# Patient Record
Sex: Male | Born: 1980 | Race: White | Hispanic: No | Marital: Single | Smoking: Current every day smoker
Health system: Southern US, Community
[De-identification: ages and names within clinical notes are randomized; demographics above are authoritative.]

---

## 2006-04-20 ENCOUNTER — Emergency Department (HOSPITAL_COMMUNITY): Admission: EM | Admit: 2006-04-20 | Discharge: 2006-04-20 | Payer: Self-pay | Admitting: Emergency Medicine

## 2006-09-16 ENCOUNTER — Emergency Department (HOSPITAL_COMMUNITY): Admission: EM | Admit: 2006-09-16 | Discharge: 2006-09-16 | Payer: Self-pay | Admitting: Emergency Medicine

## 2006-11-13 ENCOUNTER — Emergency Department (HOSPITAL_COMMUNITY): Admission: EM | Admit: 2006-11-13 | Discharge: 2006-11-13 | Payer: Self-pay | Admitting: Emergency Medicine

## 2007-09-03 ENCOUNTER — Emergency Department (HOSPITAL_COMMUNITY): Admission: EM | Admit: 2007-09-03 | Discharge: 2007-09-03 | Payer: Self-pay | Admitting: Emergency Medicine

## 2009-04-23 ENCOUNTER — Emergency Department (HOSPITAL_COMMUNITY): Admission: EM | Admit: 2009-04-23 | Discharge: 2009-04-23 | Payer: Self-pay | Admitting: Emergency Medicine

## 2009-10-10 ENCOUNTER — Emergency Department: Payer: Self-pay | Admitting: Emergency Medicine

## 2009-11-15 ENCOUNTER — Emergency Department: Payer: Self-pay | Admitting: Emergency Medicine

## 2010-03-20 ENCOUNTER — Emergency Department: Payer: Self-pay | Admitting: Emergency Medicine

## 2010-09-28 ENCOUNTER — Emergency Department: Payer: Self-pay | Admitting: Emergency Medicine

## 2011-03-05 ENCOUNTER — Emergency Department: Payer: Self-pay | Admitting: Emergency Medicine

## 2011-04-14 ENCOUNTER — Emergency Department: Payer: Self-pay | Admitting: Emergency Medicine

## 2011-11-27 ENCOUNTER — Emergency Department: Payer: Self-pay | Admitting: *Deleted

## 2011-11-27 LAB — TROPONIN I: Troponin-I: <0.02 ng/mL

## 2011-11-27 LAB — CBC
HGB: 16.5 g/dL (ref 13.0–18.0)
MCH: 32 pg (ref 26.0–34.0)
RBC: 5.16 10*6/uL (ref 4.40–5.90)

## 2011-11-27 LAB — BASIC METABOLIC PANEL
Calcium, Total: 9.7 mg/dL (ref 8.5–10.1)
Co2: 26 mmol/L (ref 21–32)
Potassium: 4 mmol/L (ref 3.5–5.1)
Sodium: 139 mmol/L (ref 136–145)

## 2012-04-10 IMAGING — CR DG LUMBAR SPINE 2-3V
1 series · 3 of 3 positions shown · non-contrast
Comparison: none

REASON FOR EXAM: fall/ injury    RME 5
COMMENTS:   LMP: (Male)

PROCEDURE:     DXR - DXR LUMBAR SPINE AP AND LATERAL  - October 10, 2009  [DATE]
RESULT:     The lumbar vertebral bodies are preserved in height. The
intervertebral disc space heights are well-maintained. The spinous processes
are intact. The pedicles and transverse processes appear normal.

[Series 1: view not recorded · 0.17mm/px · 3 of 3 slices shown]
[im 1/3]
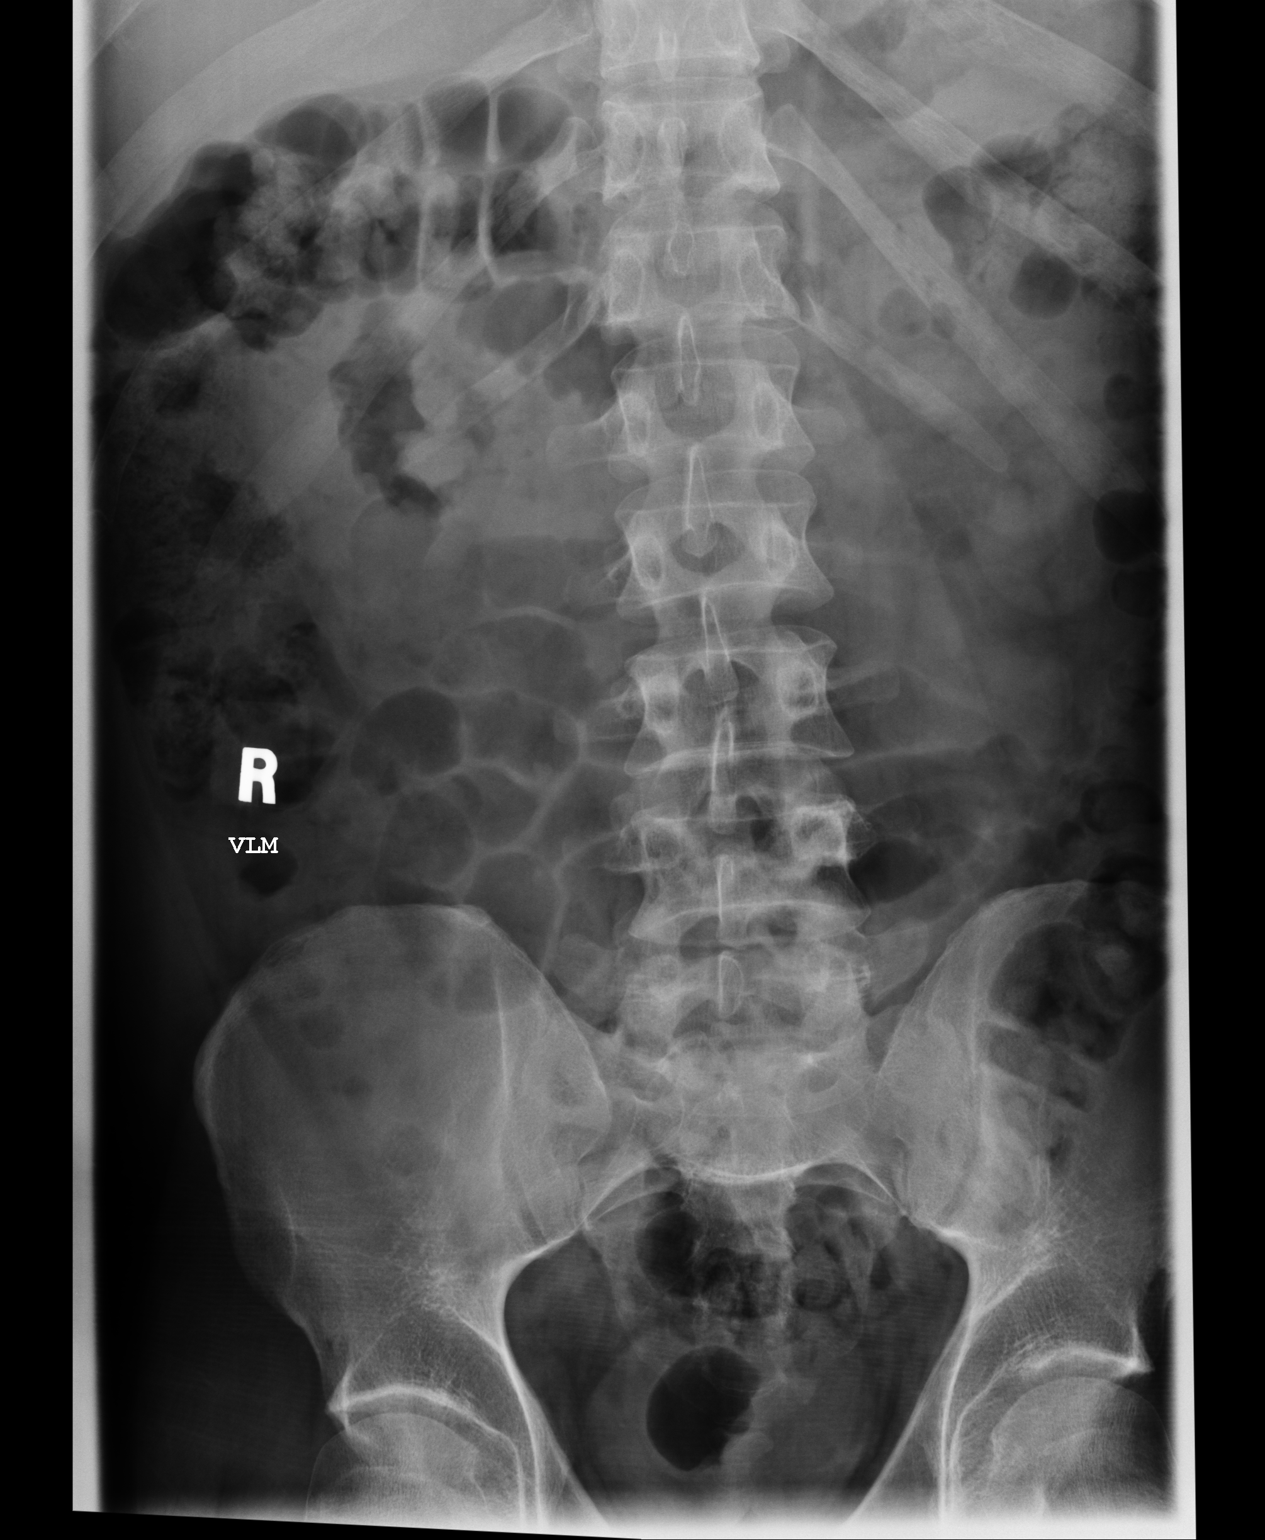
[im 2/3]
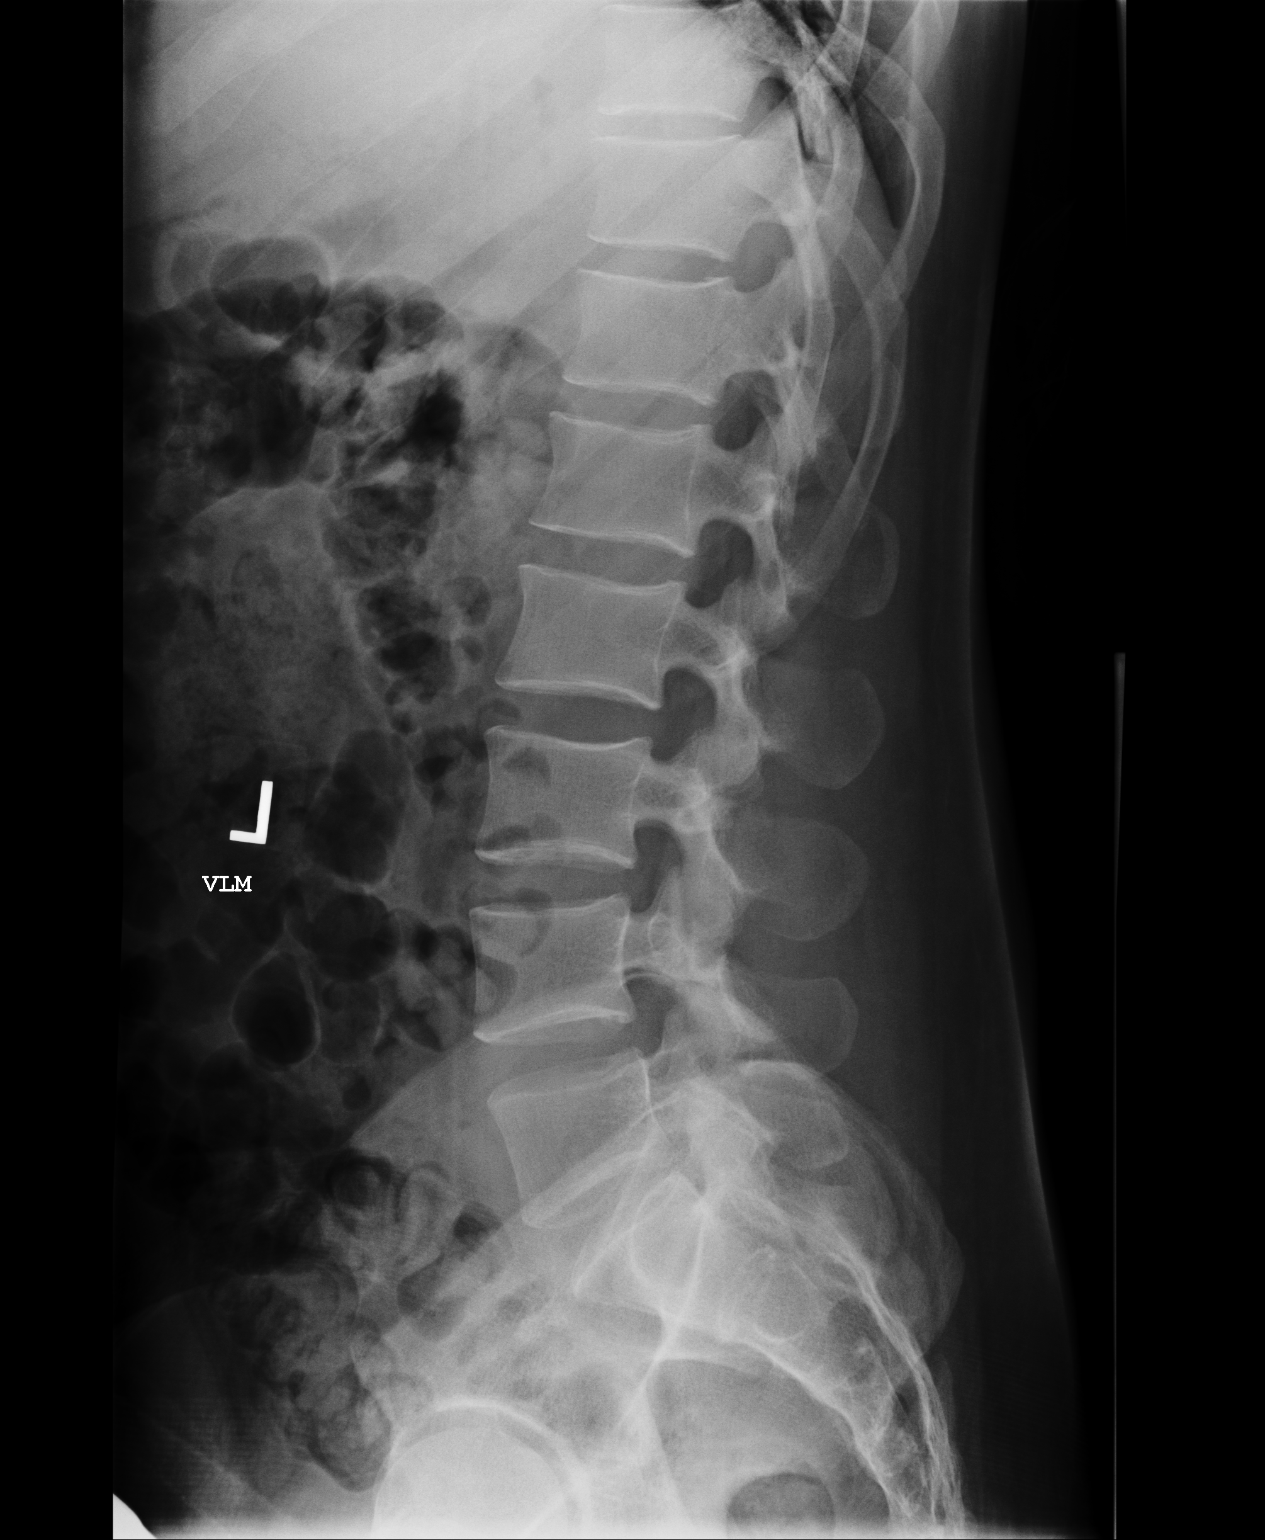
[im 3/3]
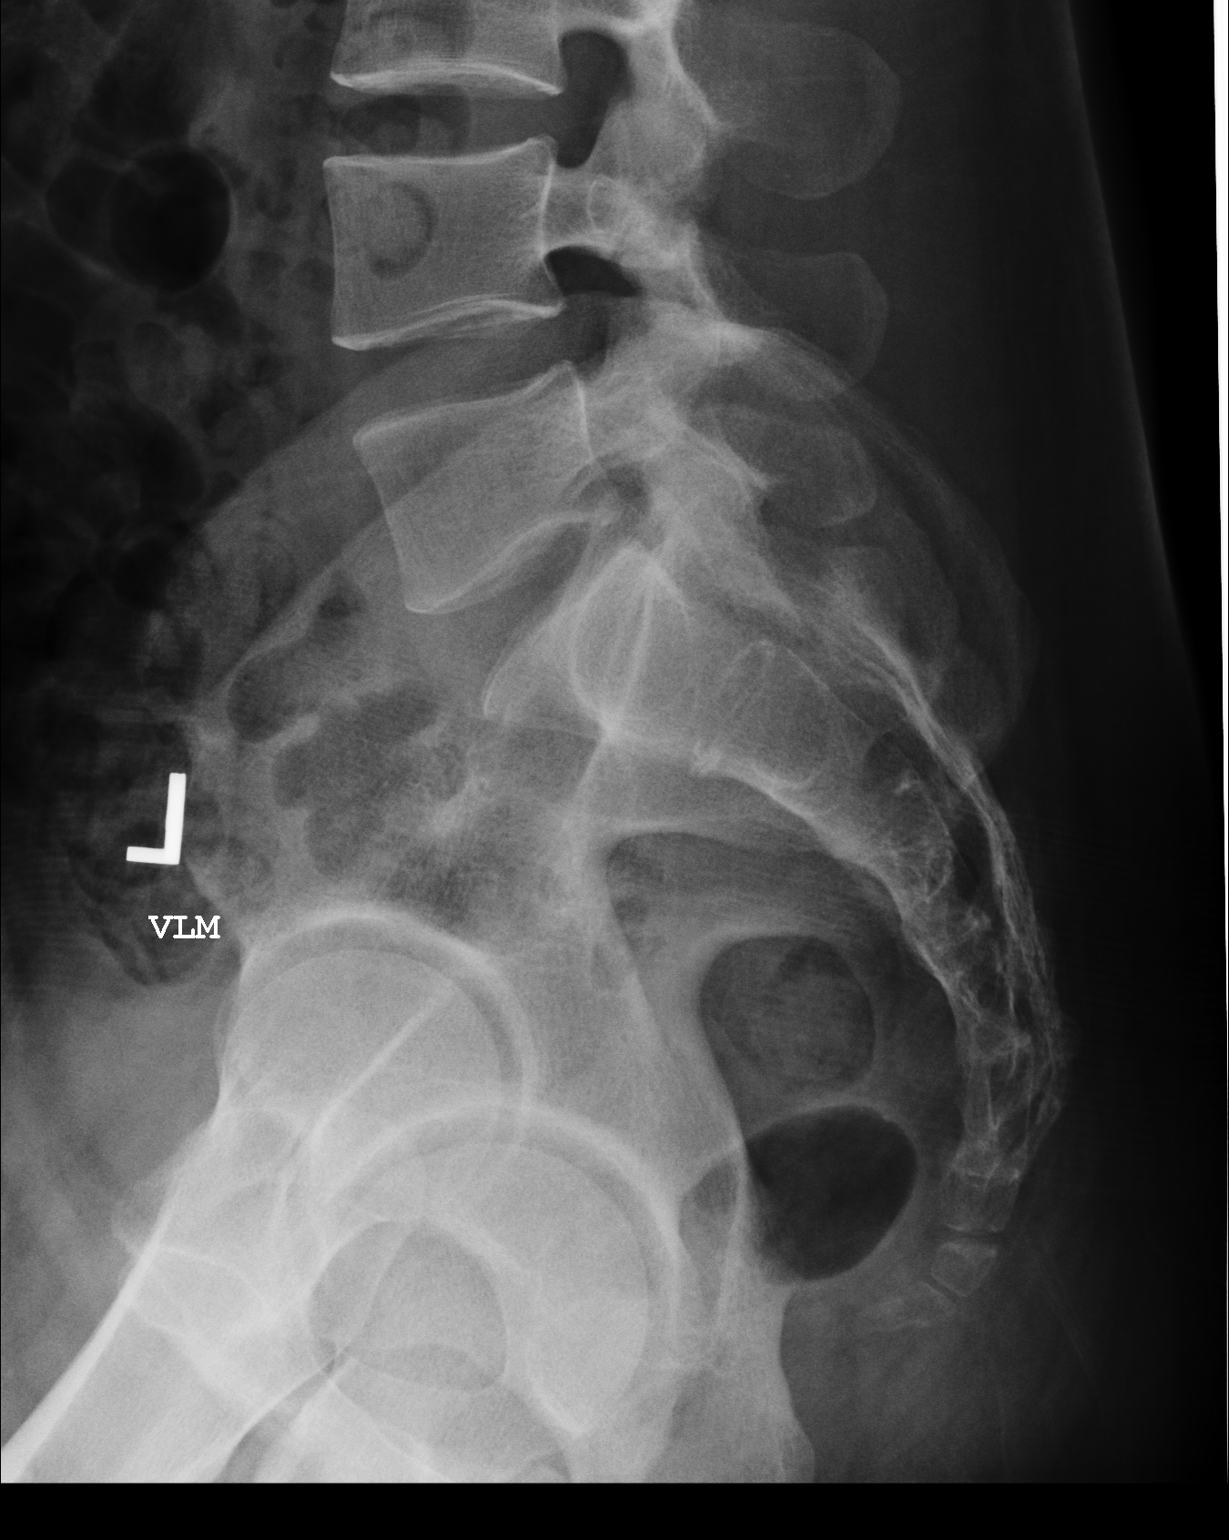

[3 of 3 positions shown; findings below may reference images not displayed]

IMPRESSION: I do not see evidence of acute bony abnormality of the
lumbar spine.

## 2012-04-10 IMAGING — CR RIGHT FOOT COMPLETE - 3+ VIEW
1 series · 3 of 3 positions shown · non-contrast
Comparison: none

REASON FOR EXAM: injury   RME 4
COMMENTS:   LMP: (Male)

[Series 1: view not recorded · 0.17mm/px · 3 of 3 slices shown]
[im 1/3]
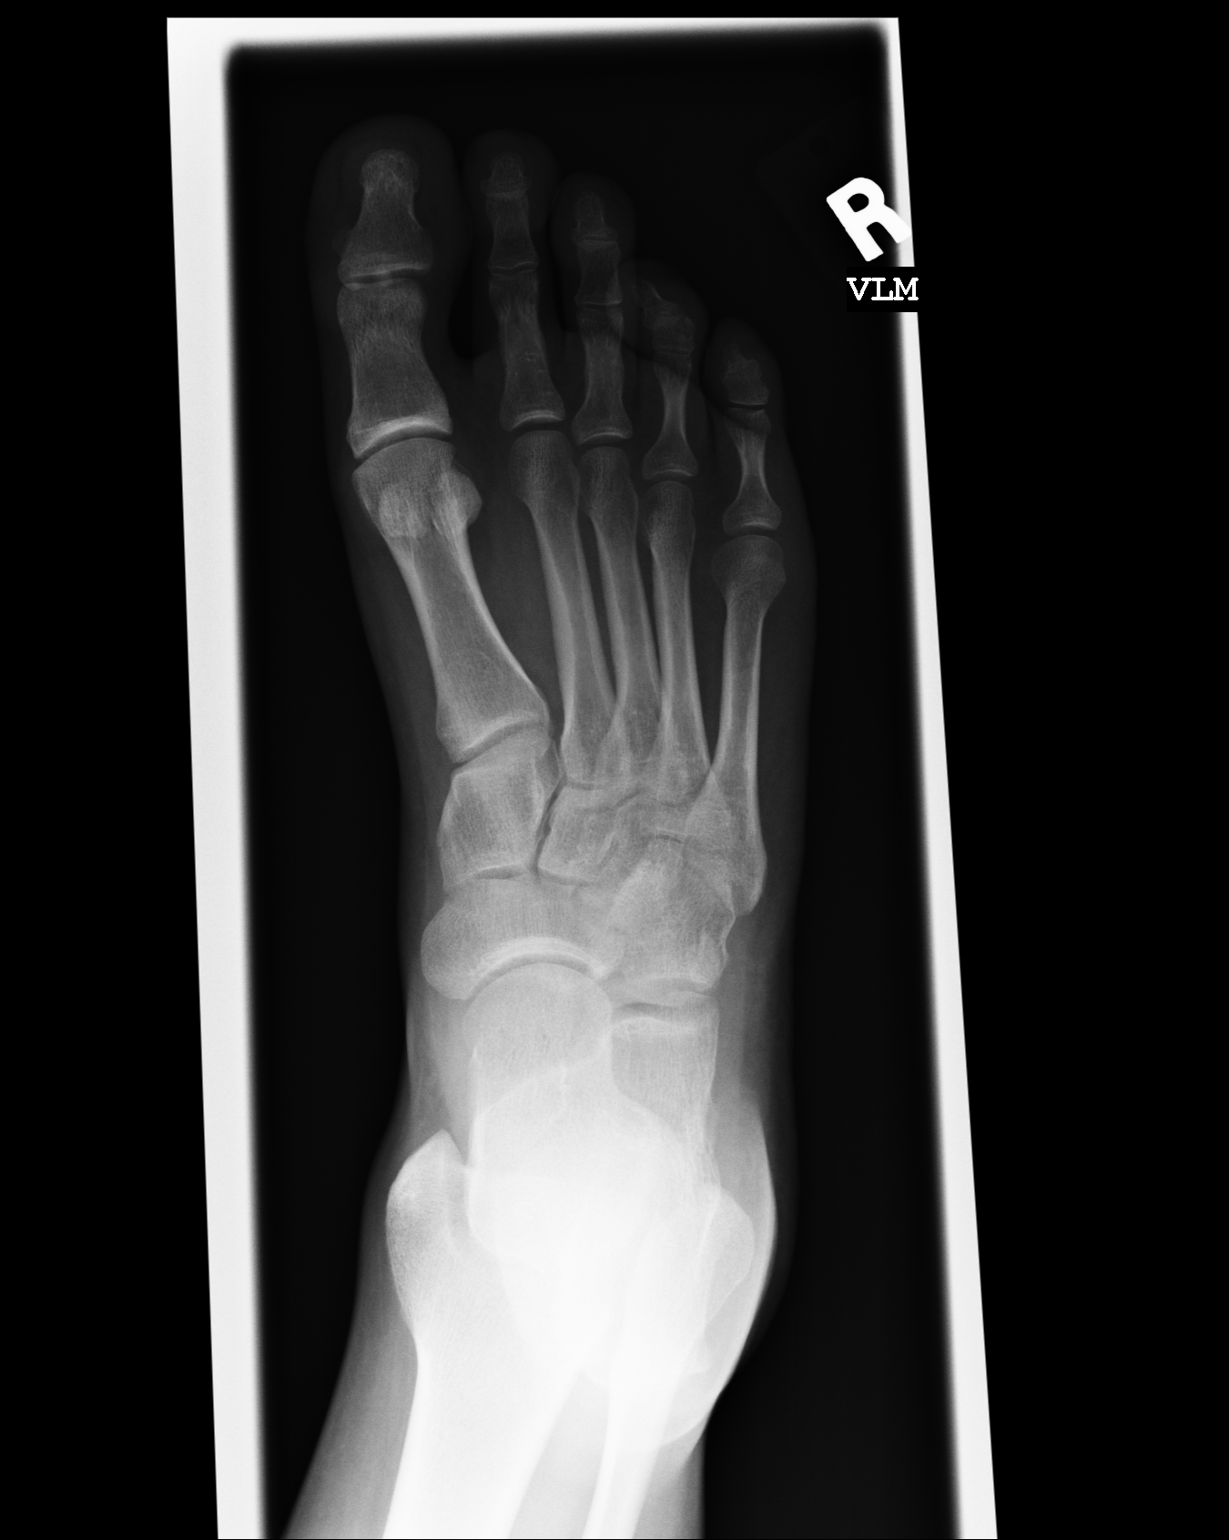
[im 2/3]
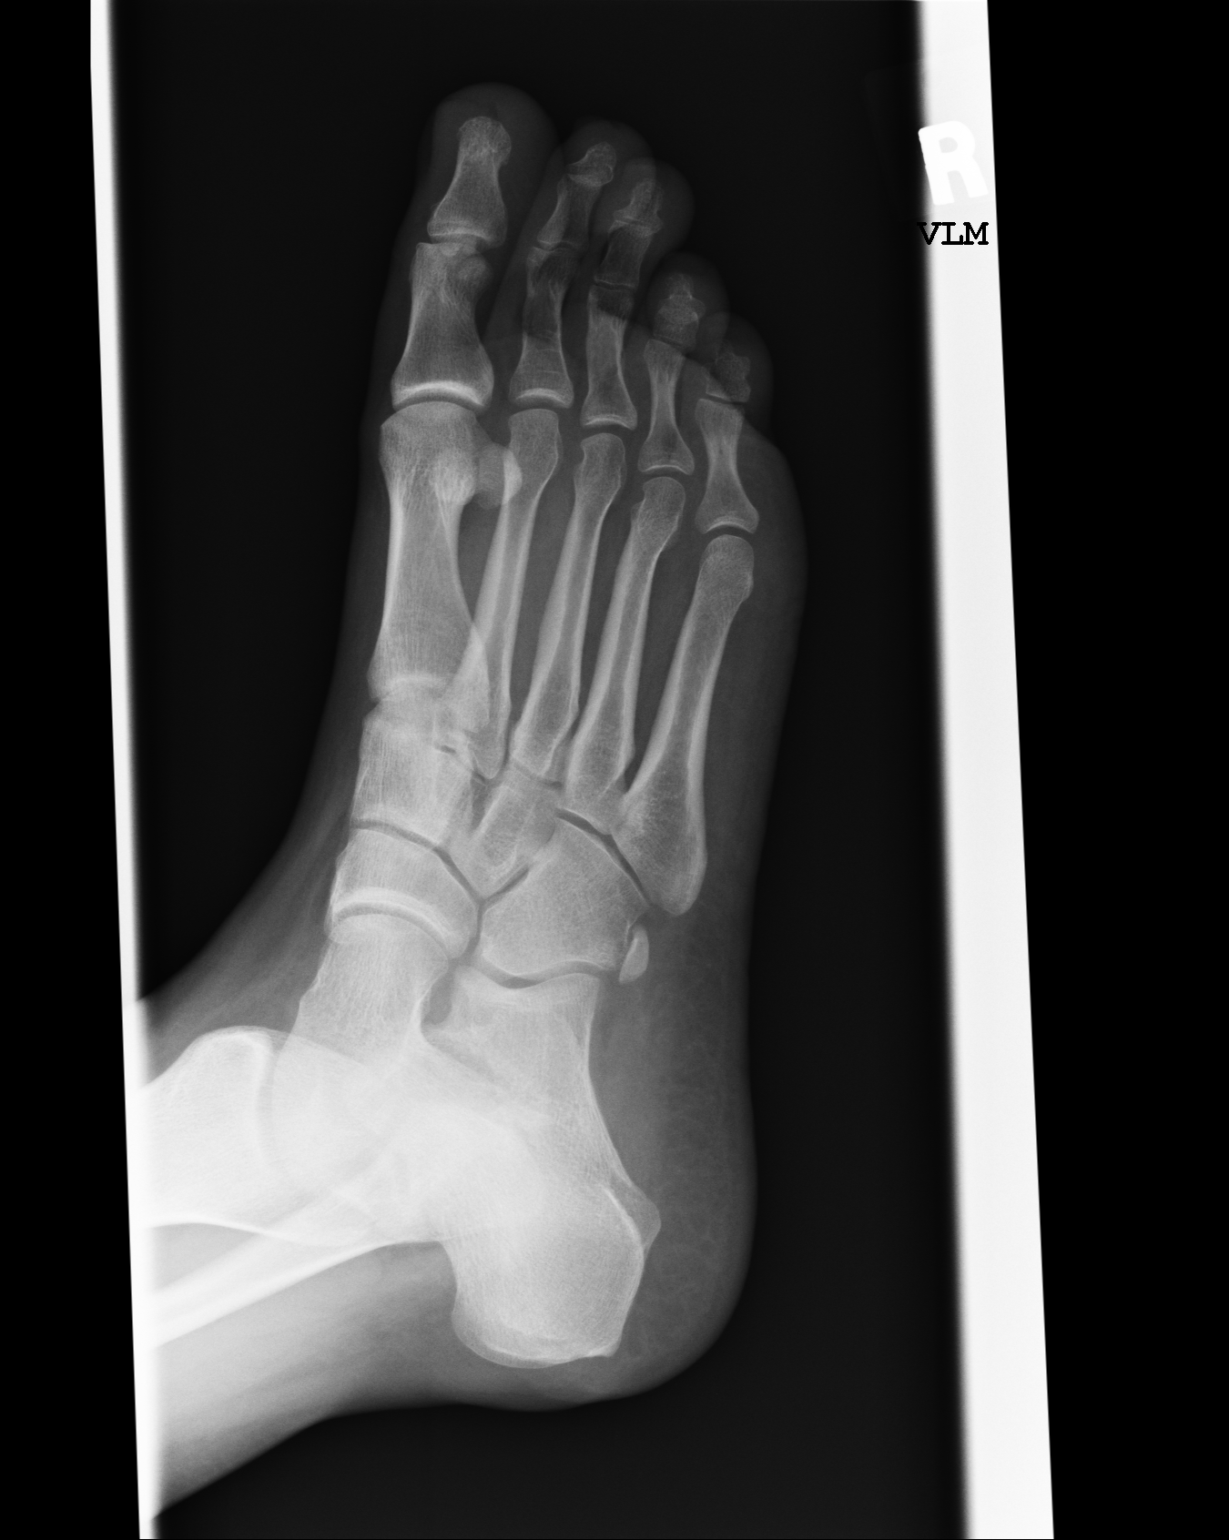
[im 3/3]
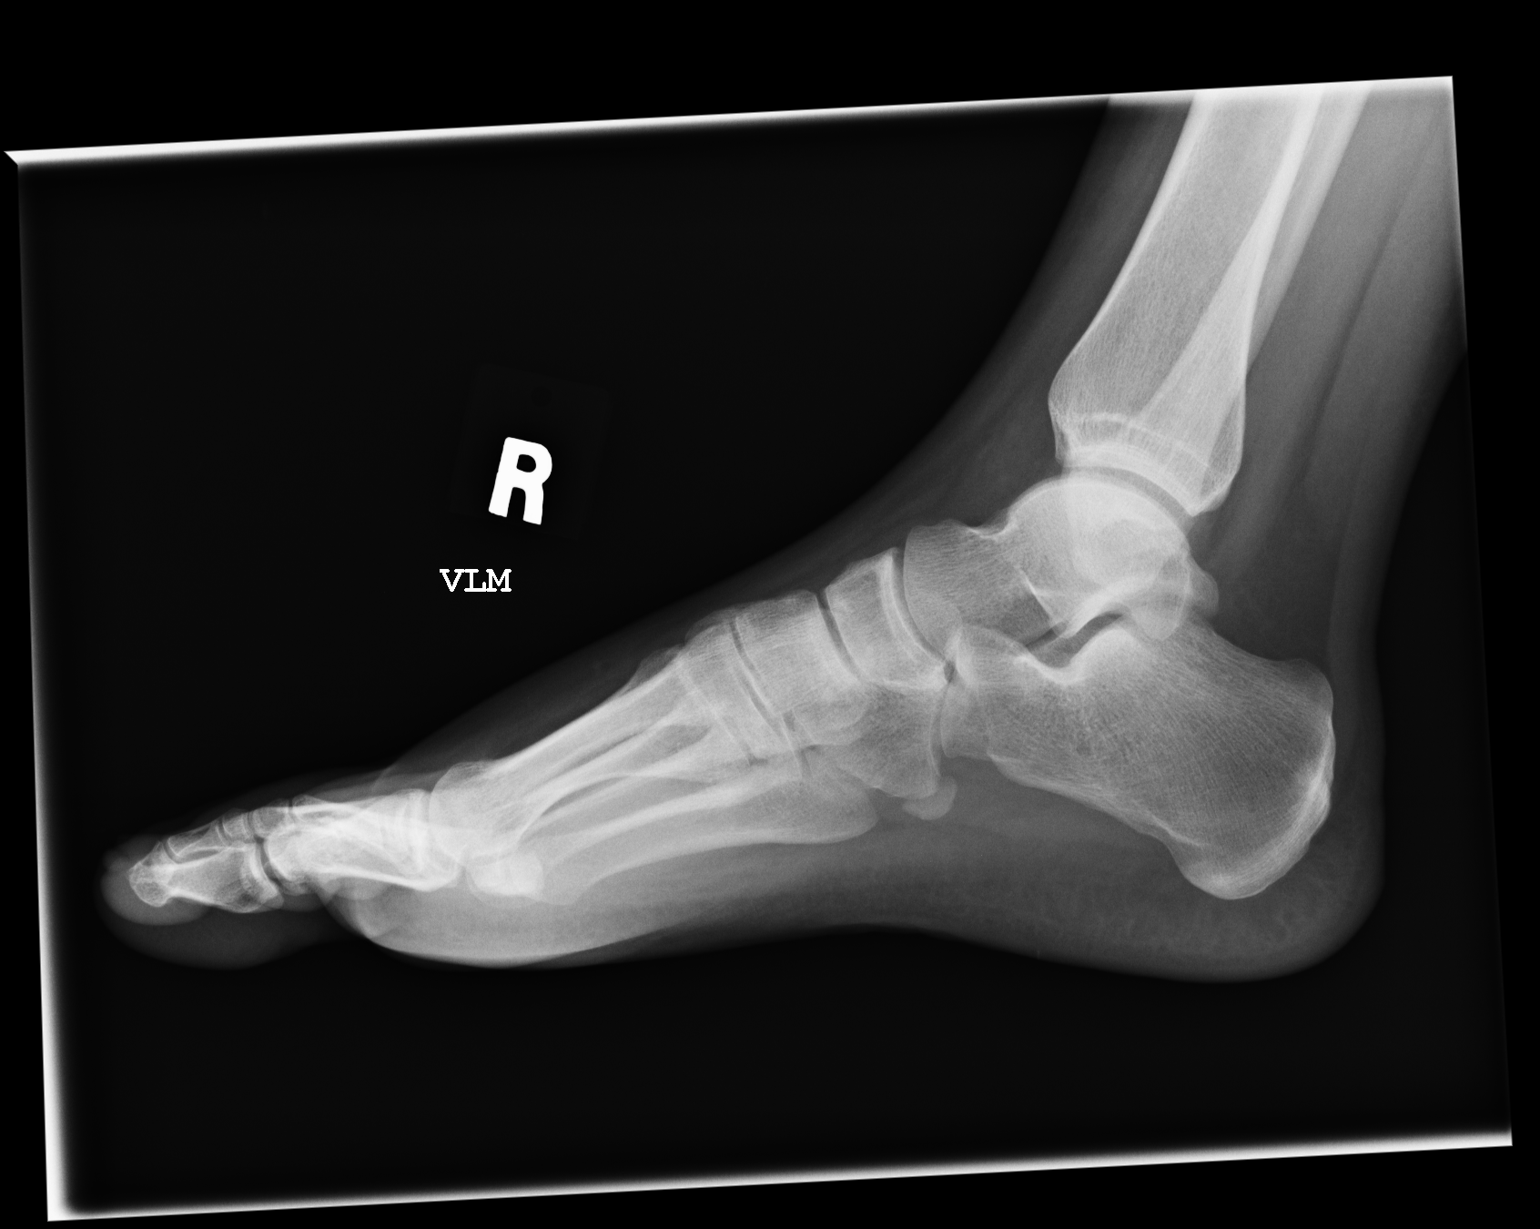

[3 of 3 positions shown; findings below may reference images not displayed]

PROCEDURE:     DXR - DXR FOOT RT COMPLETE W/OBLIQUES  - October 10, 2009  [DATE]

RESULT:     Three views of the right foot are submitted. The bones are
adequately mineralized. I do not see evidence of an acute fracture. There is
an accessory ossicle along the lateral aspect of the tarsal cuboid. No
metatarsal fracture is identified. The phalanges appear intact.
IMPRESSION: I see no acute bony abnormality of the right foot.

## 2012-06-16 ENCOUNTER — Emergency Department: Payer: Self-pay | Admitting: Emergency Medicine

## 2013-06-23 ENCOUNTER — Emergency Department: Payer: Self-pay | Admitting: Emergency Medicine

## 2014-06-20 ENCOUNTER — Emergency Department: Payer: Self-pay | Admitting: Emergency Medicine

## 2017-01-07 DIAGNOSIS — F32A Depression, unspecified: Secondary | ICD-10-CM | POA: Insufficient documentation

## 2017-01-21 DIAGNOSIS — M47812 Spondylosis without myelopathy or radiculopathy, cervical region: Secondary | ICD-10-CM | POA: Insufficient documentation

## 2017-01-21 DIAGNOSIS — K5900 Constipation, unspecified: Secondary | ICD-10-CM | POA: Insufficient documentation

## 2017-01-21 DIAGNOSIS — F321 Major depressive disorder, single episode, moderate: Secondary | ICD-10-CM | POA: Insufficient documentation

## 2017-01-21 DIAGNOSIS — Z720 Tobacco use: Secondary | ICD-10-CM | POA: Insufficient documentation

## 2017-01-21 DIAGNOSIS — I10 Essential (primary) hypertension: Secondary | ICD-10-CM | POA: Insufficient documentation

## 2017-04-13 ENCOUNTER — Other Ambulatory Visit: Payer: Self-pay

## 2017-04-13 ENCOUNTER — Ambulatory Visit
Admission: EM | Admit: 2017-04-13 | Discharge: 2017-04-13 | Disposition: A | Discharge status: Home / Self Care | Payer: 59 | Attending: Family Medicine | Admitting: Family Medicine

## 2017-04-13 ENCOUNTER — Encounter: Payer: Self-pay | Admitting: Emergency Medicine

## 2017-04-13 DIAGNOSIS — J0110 Acute frontal sinusitis, unspecified: Secondary | ICD-10-CM | POA: Diagnosis not present

## 2017-04-13 DIAGNOSIS — R05 Cough: Secondary | ICD-10-CM

## 2017-04-13 DIAGNOSIS — J0100 Acute maxillary sinusitis, unspecified: Secondary | ICD-10-CM | POA: Diagnosis not present

## 2017-04-13 DIAGNOSIS — R059 Cough, unspecified: Secondary | ICD-10-CM

## 2017-04-13 MED ORDER — BENZONATATE 100 MG PO CAPS: 100.0000 mg | ORAL_CAPSULE | Freq: Three times a day (TID) | ORAL | 0 refills | Status: DC | PRN

## 2017-04-13 MED ORDER — AMOXICILLIN-POT CLAVULANATE 875-125 MG PO TABS: 1.0000 | ORAL_TABLET | Freq: Two times a day (BID) | ORAL | 0 refills | Status: DC

## 2017-04-13 NOTE — Discharge Instructions (Signed)
Take medication as prescribed. Rest. Drink plenty of fluids.  ° °Follow up with your primary care physician this week as needed. Return to Urgent care for new or worsening concerns.  ° °

## 2017-04-13 NOTE — ED Provider Notes (Signed)
[facility] ____________________________________________  Time seen: Approximately 11:30 AM  I have reviewed the triage vital signs and the nursing notes.   HISTORY  Chief Complaint Cough; Sore Throat; and Sinus Problem   HPI Robert Herman is a 37 y.o. male presenting with spouse at bedside for evaluation of 10 days of runny nose, nasal congestion, sinus pressure, cough and chest congestion.  Reports some scratchy throat sensation, but denies pain with swallowing.  Reports continues to eat and drink well.  Denies known fevers.  Reports his continue remain active.  States symptoms unresolved with over-the-counter Mucinex D.  Reports recently exposed to a child with strep throat, but states sore throat does not feel like previous strep throat sensation.  States cough is a dry nonproductive cough but does intermittently disrupts sleep.  Denies hemoptysis.  States intermittently cough causes him to be sore throughout his torso and is present only with cough.  Denies other aggravating or alleviating factors.  Denies recent sickness or recent antibiotic use.  Reports otherwise feels well.  States just took his blood pressure medicine just prior to arrival.  Denies chest pain, shortness of breath, abdominal pain, or rash. Denies recent sickness. Denies recent antibiotic use.    past medical history. HTN Depression   There are no active problems to display for this patient.   History reviewed. No pertinent surgical history.   No current facility-administered medications for this encounter.   Current Outpatient Medications:  .  amoxicillin-clavulanate (AUGMENTIN) 875-125 MG tablet, Take 1 tablet by mouth every 12 (twelve) hours., Disp: 20 tablet, Rfl: 0 .  benzonatate (TESSALON PERLES) 100 MG capsule, Take 1 capsule (100 mg total) by mouth 3 (three) times daily as needed for cough., Disp: 15 capsule, Rfl: 0 Lisinopril norvasc abilify    Allergies Patient has no known  allergies.  History reviewed. No pertinent family history.  Social History Social History   Tobacco Use  . Smoking status: Current Every Day Smoker    Types: Cigarettes  . Smokeless tobacco: Current User    Types: Snuff  Substance Use Topics  . Alcohol use: No    Frequency: Never  . Drug use: Not on file    Review of Systems Constitutional: No fever/chills ENT: As above.  Cardiovascular: Denies chest pain. Respiratory: Denies shortness of breath. Gastrointestinal: No abdominal pain.  No nausea, no vomiting.  Musculoskeletal: Negative for back pain. Skin: Negative for rash.  ____________________________________________   PHYSICAL EXAM:  VITAL SIGNS: ED Triage Vitals  Enc Vitals Group     BP 04/13/17 1124 (!) 146/92     Pulse Rate 04/13/17 1124 94     Resp 04/13/17 1124 16     Temp 04/13/17 1124 98.4 F (36.9 C)     Temp Source 04/13/17 1124 Oral     SpO2 04/13/17 1124 100 %     Weight 04/13/17 1120 210 lb (95.3 kg)     Height 04/13/17 1120 5\' 6"  (1.676 m)     Head Circumference --      Peak Flow --      Pain Score 04/13/17 1121 7     Pain Loc --      Pain Edu? --      Excl. in GC? --     Constitutional: Alert and oriented. Well appearing and in no acute distress. Eyes: Conjunctivae are normal.  Head: Atraumatic.Mild to moderate tenderness to palpation bilateral frontal and maxillary sinuses. No swelling. No erythema.   Ears: no erythema,  normal TMs bilaterally.   Nose: nasal congestion with bilateral nasal turbinate erythema and edema.   Mouth/Throat: Mucous membranes are moist.  Oropharynx non-erythematous.No tonsillar swelling or exudate.  Neck: No stridor.  No cervical spine tenderness to palpation. Hematological/Lymphatic/Immunilogical: No cervical lymphadenopathy. Cardiovascular: Normal rate, regular rhythm. Grossly normal heart sounds.  Good peripheral circulation. Respiratory: Normal respiratory effort.  No retractions. No wheezes, rales or rhonchi.  Good air movement.  Musculoskeletal:  No cervical, thoracic or lumbar tenderness to palpation.  Neurologic:  Normal speech and language. No gross focal neurologic deficits are appreciated. No gait instability. Skin:  Skin is warm, dry and intact. No rash noted. Psychiatric: Mood and affect are normal. Speech and behavior are normal.  ___________________________________________   LABS (all labs ordered are listed, but only abnormal results are displayed)  Labs Reviewed - No data to display ____________________________________________  PROCEDURES Procedures   INITIAL IMPRESSION / ASSESSMENT AND PLAN / ED COURSE  Pertinent labs & imaging results that were available during my care of the patient were reviewed by me and considered in my medical decision making (see chart for details).  Well-appearing patient.  No acute distress.  Suspect recent viral upper respiratory infection with will treat patient with oral Augmentin and PRN Tessalon Perles.  Encourage rest, fluids, supportive care.  Caution taking over-the-counter decongestant such as Sudafed relating to increase blood pressure, and discussed monitoring blood pressure at home.  Discussed indication, risks and benefits of medications with patient.  Discussed follow up with Primary care physician this week. Discussed follow up and return parameters including no resolution or any worsening concerns. Patient verbalized understanding and agreed to plan.   ____________________________________________   FINAL CLINICAL IMPRESSION(S) / ED DIAGNOSES  Final diagnoses:  Acute maxillary sinusitis, recurrence not specified  Acute frontal sinusitis, recurrence not specified  Cough     ED Discharge Orders        Ordered    amoxicillin-clavulanate (AUGMENTIN) 875-125 MG tablet  Every 12 hours     04/13/17 1134    benzonatate (TESSALON PERLES) 100 MG capsule  3 times daily PRN     04/13/17 1134       Note: This dictation was prepared  with Dragon dictation along with smaller phrase technology. Any transcriptional errors that result from this process are unintentional.         Renford Dills, NP 04/13/17 1150

## 2017-04-13 NOTE — ED Triage Notes (Signed)
Patient c/o runny nose, sinus congestion and pressure, cough and chest congestion that started on Christmas Eve.

## 2022-02-09 ENCOUNTER — Encounter: Payer: Self-pay | Admitting: Emergency Medicine

## 2022-02-09 ENCOUNTER — Other Ambulatory Visit: Payer: Self-pay

## 2022-02-09 ENCOUNTER — Emergency Department
Admission: EM | Admit: 2022-02-09 | Discharge: 2022-02-09 | Disposition: A | Discharge status: Home / Self Care | Payer: Self-pay | Attending: Emergency Medicine | Admitting: Emergency Medicine

## 2022-02-09 ENCOUNTER — Emergency Department: Payer: Self-pay

## 2022-02-09 DIAGNOSIS — S93401A Sprain of unspecified ligament of right ankle, initial encounter: Secondary | ICD-10-CM | POA: Insufficient documentation

## 2022-02-09 DIAGNOSIS — I10 Essential (primary) hypertension: Secondary | ICD-10-CM | POA: Insufficient documentation

## 2022-02-09 DIAGNOSIS — W502XXA Accidental twist by another person, initial encounter: Secondary | ICD-10-CM | POA: Insufficient documentation

## 2022-02-09 MED ORDER — LISINOPRIL 20 MG PO TABS: 20.0000 mg | ORAL_TABLET | Freq: Every day | ORAL | 0 refills | Status: DC

## 2022-02-09 MED ORDER — MELOXICAM 15 MG PO TABS: 15.0000 mg | ORAL_TABLET | Freq: Every day | ORAL | 2 refills | Status: AC

## 2022-02-09 MED ORDER — AMLODIPINE BESYLATE 10 MG PO TABS: 10.0000 mg | ORAL_TABLET | Freq: Every day | ORAL | 0 refills | Status: DC

## 2022-02-09 MED ORDER — TRAMADOL HCL 50 MG PO TABS
50.0000 mg | ORAL_TABLET | Freq: Once | ORAL | Status: AC
  Administered 2022-02-09: 50 mg via ORAL
  Filled 2022-02-09: qty 1

## 2022-02-09 MED ORDER — TRAMADOL HCL 50 MG PO TABS: 50.0000 mg | ORAL_TABLET | Freq: Four times a day (QID) | ORAL | 0 refills | Status: DC | PRN

## 2022-02-09 NOTE — ED Provider Triage Note (Signed)
  Emergency Medicine Provider Triage Evaluation Note  Robert Herman , a 41 y.o.male,  was evaluated in triage.  Pt complains of right foot/ankle pain.  Patient states that he stepped on a hole last week and rolled his ankle.  Since then, he has had persistent progressive pain in his foot and ankle.  He states that he has still been able to ambulate, though with difficulty.   Review of Systems  Positive: Right foot/ankle pain. Negative: Denies fever, chest pain, vomiting  Physical Exam   Vitals:   02/09/22 1440  BP: (!) 151/103  Pulse: 73  Resp: 18  Temp: 98 F (36.7 C)  SpO2: 98%   Gen:   Awake, no distress   Resp:  Normal effort  MSK:   Moves extremities without difficulty  Other:    Medical Decision Making  Given the patient's initial medical screening exam, the following diagnostic evaluation has been ordered. The patient will be placed in the appropriate treatment space, once one is available, to complete the evaluation and treatment. I have discussed the plan of care with the patient and I have advised the patient that an ED physician or mid-level practitioner will reevaluate their condition after the test results have been received, as the results may give them additional insight into the type of treatment they may need.    Diagnostics: Right ankle x-ray, right foot x-ray.  Treatments: none immediately   Teodoro Spray, [place] 02/09/22 1600

## 2022-02-09 NOTE — ED Provider Notes (Signed)
[facility] Provider Note    Event Date/Time   First MD Initiated Contact with Patient 02/09/22 1503     (approximate)   History   Foot Pain   HPI  Robert Herman is a 41 y.o. male with no significant past medical history other than hypertension presents emergency department complaining of right foot and ankle pain.  Patient states that he stepped in a hole about 2 weeks ago but has continued to have severe pain.  Pain is with movement and with weightbearing.  No numbness or tingling.  States he has been taking ibuprofen without any relief.  He is also out of his blood pressure medications.      Physical Exam   Triage Vital Signs: ED Triage Vitals  Enc Vitals Group     BP 02/09/22 1440 (!) 151/103     Pulse Rate 02/09/22 1440 73     Resp 02/09/22 1440 18     Temp 02/09/22 1440 98 F (36.7 C)     Temp Source 02/09/22 1440 Oral     SpO2 02/09/22 1440 98 %     Weight 02/09/22 1441 250 lb (113.4 kg)     Height 02/09/22 1441 5\' 7"  (1.702 m)     Head Circumference --      Peak Flow --      Pain Score 02/09/22 1446 10     Pain Loc --      Pain Edu? --      Excl. in GC? --     Most recent vital signs: Vitals:   02/09/22 1440  BP: (!) 151/103  Pulse: 73  Resp: 18  Temp: 98 F (36.7 C)  SpO2: 98%     General: Awake, no distress.   CV:  Good peripheral perfusion. regular rate and  rhythm Resp:  Normal effort.  Abd:  No distention.   Other:  Right ankle is very tender to palpation, right foot is tender across the dorsum proximally, pain is reproduced with range of motion, neurovascular is intact   ED Results / Procedures / Treatments   Labs (all labs ordered are listed, but only abnormal results are displayed) Labs Reviewed - No data to display   EKG     RADIOLOGY X-ray of the right ankle and right foot    PROCEDURES:   Procedures   MEDICATIONS ORDERED IN ED: Medications  traMADol (ULTRAM) tablet 50 mg (50 mg Oral  Given 02/09/22 1533)     IMPRESSION / MDM / ASSESSMENT AND PLAN / ED COURSE  I reviewed the triage vital signs and the nursing notes.                              Differential diagnosis includes, but is not limited to, sprain, fracture, contusion  Patient's presentation is most consistent with acute complicated illness / injury requiring diagnostic workup.   X-ray of the right foot and ankle independently reviewed and interpreted by me as being negative for fracture.  Confirmed by radiology  I did explain these findings to the patient.  Is placed in a cam walker, given crutches, meloxicam daily.  Tramadol for severe pain.  We did discuss narcotic use in the chance of addiction.  Patient is in agreement with her treatment plan.  I also refilled his amlodipine and lisinopril.  Patient states that he takes 10 of amlodipine and 20 of lisinopril.  He was followed at  [facility] but has not seen his doctor recently.  I did encourage him to follow-up at [facility] for his regular needs, he could also visit them for orthopedic needs but I also gave him follow-up with orthopedics at [facility].  Patient is in agreement treatment plan.  He was discharged stable condition.      FINAL CLINICAL IMPRESSION(S) / ED DIAGNOSES   Final diagnoses:  Sprain of right ankle, unspecified ligament, initial encounter     Rx / DC Orders   ED Discharge Orders          Ordered    meloxicam (MOBIC) 15 MG tablet  Daily        02/09/22 1531    traMADol (ULTRAM) 50 MG tablet  Every 6 hours PRN        02/09/22 1531    amLODipine (NORVASC) 10 MG tablet  Daily        02/09/22 1532    lisinopril (ZESTRIL) 20 MG tablet  Daily        02/09/22 1532             Note:  This document was prepared using Dragon voice recognition software and may include unintentional dictation errors.    Versie Starks, PA-C 02/09/22 1600    Rada Hay, MD 02/09/22 [phone]

## 2022-02-09 NOTE — ED Triage Notes (Signed)
Pt here with right foot pain x2 weeks. Pt stepped in a hole and thinks it may be twisted. Pt states he is not able to bear weight on the foot any longer.

## 2022-02-09 NOTE — Discharge Instructions (Signed)
Follow up with your regular doctor if not improving in 7 days, or see orthopedics Elevate and ice the ankle Wear the boot while walking, you may remove to sleep and while sitting Take the medication as prescribed

## 2024-01-05 NOTE — Progress Notes (Signed)
 Subjective:  Patient ID: Robert Herman, male    DOB: 1980/11/26, 43 y.o.   MRN: [id number]  Patient Care Team: Deitra Morton Sebastian Nena, NP as PCP - General (Nurse Practitioner)   Chief Complaint:  Establish Care   HPI: Robert Herman is a 43 y.o. male presenting on 01/09/2024 for Establish Care   Discussed the use of AI scribe software for clinical note transcription with the patient, who gave verbal consent to proceed.  History of Present Illness Robert Herman is a 43 year old male with hypertension who presents for management of high blood pressure and weight loss. His grandmother is present at the visit.  He has a history of hypertension and reports persistent high blood pressure despite being on amlodipine  10 mg and lisinopril  10 mg daily. He experiences significant leg swelling, which he attributes to amlodipine . He acknowledges a high salt intake in his diet, which he believes contributes to his uncontrolled blood pressure.  He is concerned about his weight, describing himself as overweight and having difficulty losing weight despite attempts at dieting and walking for exercise. He tends to overeat and is interested in medication to assist with weight loss. He is unsure if his insurance covers such medication.  He takes several medications including Celexa, Buspar 10 mg three times a day, trazodone, meloxicam , and a small dose of prednisone for 12 days. He is not currently taking Mobic  due to the prednisone. He also mentions taking ranitidine in the past for indigestion but now uses Tums frequently. He experiences significant indigestion and has a history of an ulcer. He consumes a lot of spicy and acidic foods, which exacerbate his symptoms.  His family history includes type 2 diabetes in his mother. There is no family history of thyroid cancer or other cancers. He has moved from [place] and is adjusting to a new location.      Relevant past medical, surgical, family,  and social history reviewed and updated as indicated.  Allergies and medications reviewed and updated. Data reviewed: Chart in Epic.   History reviewed. No pertinent past medical history.  History reviewed. No pertinent surgical history.  Social History   Socioeconomic History   Marital status: Single    Spouse name: Not on file   Number of children: Not on file   Years of education: Not on file   Highest education level: Not on file  Occupational History   Not on file  Tobacco Use   Smoking status: Every Day    Current packs/day: 0.50    Types: Cigarettes   Smokeless tobacco: Former    Types: Snuff  Vaping Use   Vaping status: Every Day  Substance and Sexual Activity   Alcohol use: No   Drug use: Never   Sexual activity: Yes  Other Topics Concern   Not on file  Social History Narrative   Not on file   Social Drivers of Health   Financial Resource Strain: Not on file  Food Insecurity: Not on file  Transportation Needs: Not on file  Physical Activity: Not on file  Stress: Not on file  Social Connections: Not on file  Intimate Partner Violence: Not on file    Outpatient Encounter Medications as of 01/09/2024  Medication Sig   albuterol (VENTOLIN HFA) 108 (90 Base) MCG/ACT inhaler Inhale into the lungs every 6 (six) hours as needed for wheezing or shortness of breath.   busPIRone (BUSPAR) 15 MG tablet Take 10 mg by mouth  2 (two) times daily.   citalopram (CELEXA) 20 MG tablet Take 20 mg by mouth daily.   meloxicam  (MOBIC ) 15 MG tablet Take 15 mg by mouth daily.   pantoprazole (PROTONIX) 20 MG tablet Take 1 tablet (20 mg total) by mouth daily.   predniSONE (DELTASONE) 5 MG tablet Take 5 mg by mouth daily with breakfast.   semaglutide-weight management (WEGOVY) 0.25 MG/0.5ML SOAJ SQ injection Inject 0.25 mg into the skin every 7 (seven) days.   traZODone (DESYREL) 100 MG tablet Take 100 mg by mouth at bedtime.   valsartan-hydrochlorothiazide (DIOVAN HCT) 80-12.5 MG  tablet Take 1 tablet by mouth daily.   [DISCONTINUED] amLODipine  (NORVASC ) 10 MG tablet Take 1 tablet (10 mg total) by mouth daily.   [DISCONTINUED] lisinopril  (ZESTRIL ) 10 MG tablet Take 10 mg by mouth daily.   ranitidine (ZANTAC) 150 MG tablet Take 150 mg by mouth 2 (two) times daily. (Patient not taking: Reported on 01/09/2024)   [DISCONTINUED] ARIPiprazole (ABILIFY) 5 MG tablet Take 5 mg by mouth daily. (Patient not taking: Reported on 01/09/2024)   [DISCONTINUED] DULoxetine (CYMBALTA) 30 MG capsule Take 30 mg by mouth daily. (Patient not taking: Reported on 01/09/2024)   [DISCONTINUED] gabapentin (NEURONTIN) 300 MG capsule Take 300 mg by mouth at bedtime. (Patient not taking: Reported on 01/09/2024)   [DISCONTINUED] lisinopril  (ZESTRIL ) 20 MG tablet Take 1 tablet (20 mg total) by mouth daily.   [DISCONTINUED] temazepam (RESTORIL) 30 MG capsule Take 30 mg by mouth at bedtime as needed for sleep. (Patient not taking: Reported on 01/09/2024)   [DISCONTINUED] traMADol  (ULTRAM ) 50 MG tablet Take 1 tablet (50 mg total) by mouth every 6 (six) hours as needed. (Patient not taking: Reported on 01/09/2024)   No facility-administered encounter medications on file as of 01/09/2024.    No Known Allergies  Pertinent ROS per HPI, otherwise unremarkable      Objective:  BP 130/89   Pulse 91   Temp (!) 97.4 F (36.3 C) (Temporal)   Ht 5' 7 (1.702 m)   Wt 276 lb 9.6 oz (125.5 kg)   SpO2 95%   BMI 43.32 kg/m    Wt Readings from Last 3 Encounters:  01/09/24 276 lb 9.6 oz (125.5 kg)  02/09/22 250 lb (113.4 kg)  04/13/17 210 lb (95.3 kg)   BP Readings from Last 3 Encounters:  01/09/24 130/89  02/09/22 (!) 151/103  04/13/17 (!) 146/92     Physical Exam Vitals and nursing note reviewed.  Constitutional:      General: He is not in acute distress.    Appearance: He is obese.  HENT:     Head: Normocephalic and atraumatic.     Right Ear: Tympanic membrane, ear canal and external ear normal. There  is no impacted cerumen.     Left Ear: Tympanic membrane, ear canal and external ear normal. There is no impacted cerumen.     Nose: Nose normal.     Mouth/Throat:     Mouth: Mucous membranes are moist.  Eyes:     General: No scleral icterus.    Extraocular Movements: Extraocular movements intact.     Conjunctiva/sclera: Conjunctivae normal.     Pupils: Pupils are equal, round, and reactive to light.  Neck:     Vascular: No carotid bruit.  Cardiovascular:     Heart sounds: Normal heart sounds.  Pulmonary:     Effort: Pulmonary effort is normal.     Breath sounds: Normal breath sounds.  Abdominal:  General: Bowel sounds are normal.     Palpations: Abdomen is soft. There is no mass.  Musculoskeletal:        General: Normal range of motion.     Cervical back: Normal range of motion and neck supple. No rigidity or tenderness.     Right lower leg: No edema.     Left lower leg: No edema.  Lymphadenopathy:     Cervical: No cervical adenopathy.  Skin:    General: Skin is warm and dry.  Neurological:     Mental Status: He is alert and oriented to person, place, and time.  Psychiatric:        Mood and Affect: Mood normal.        Behavior: Behavior normal.        Thought Content: Thought content normal.    Physical Exam      No results found for this or any previous visit.     Pertinent labs & imaging results that were available during my care of the patient were reviewed by me and considered in my medical decision making.  Assessment & Plan:  Aasim was seen today for establish care.  Diagnoses and all orders for this visit:  Encounter for general adult medical examination with abnormal findings -     CBC with Differential/Platelet -     CMP14+EGFR -     HepB+HepC+HIV Panel -     Lipid panel -     Thyroid Panel With TSH  Hypertension, essential -     CMP14+EGFR -     valsartan-hydrochlorothiazide (DIOVAN HCT) 80-12.5 MG tablet; Take 1 tablet by mouth daily.  Major  depressive disorder, single episode, moderate (HCC) -     Thyroid Panel With TSH  Nocturia -     PSA, total and free  Screening PSA (prostate specific antigen) -     PSA, total and free  Class 3 severe obesity with body mass index (BMI) of 40.0 to 44.9 in adult, unspecified obesity type, unspecified whether serious comorbidity present -     Lipid panel -     Thyroid Panel With TSH -     semaglutide-weight management (WEGOVY) 0.25 MG/0.5ML SOAJ SQ injection; Inject 0.25 mg into the skin every 7 (seven) days. -     Bayer DCA Hb A1c Waived  Screening for colon cancer -     Cologuard  Family history of diabetes mellitus in mother -     Bayer DCA Hb A1c Waived  Other orders -     pantoprazole (PROTONIX) 20 MG tablet; Take 1 tablet (20 mg total) by mouth daily.     Assessment and Plan Assessment & Plan Essential hypertension with adverse effect from amlodipine  Hypertension uncontrolled with amlodipine  and lisinopril . Significant leg swelling from amlodipine . High sodium intake exacerbates hypertension. - Discontinue amlodipine  and lisinopril . - Prescribe losartan 80 mg with hydrochlorothiazide 12.5 mg daily. - Encourage low sodium diet. - Instruct to monitor blood pressure twice daily and record readings. - Schedule follow-up in 4 weeks to reassess blood pressure and medication efficacy. Repeat BP 130/89 Obesity Obesity with difficulty in weight management. Discussed GLP-1 receptor agonist for weight loss. Insurance coverage for [employer] uncertain. Emphasized lifestyle changes with medication. - Submit prior authorization for Wegovy 0.25 mg weekly. - Instruct to follow up in 4 weeks for dose adjustment if approved. - Encourage diet and exercise modifications to support weight loss.  Gastroesophageal reflux disease Significant indigestion with frequent Tums use. High intake of spicy  and acidic foods contributes to symptoms. - Prescribe pantoprazole 20 mg twice daily. - Advise to  avoid spicy, acidic, and citrus foods. - Instruct to take pantoprazole one hour before or after meals.  Depression Managed with Celexa and Buspar. Discontinued duloxetine due to prednisone use. - Continue Celexa and Buspar. - Discontinue duloxetine.  General Health Maintenance Discussed colon cancer screening. Explained Cologuard as a non-invasive option with high accuracy. - Order Cologuard for colon cancer screening.   Labs: CBC, CMP, lipid, TSH, PSA, A1c, hep B hep C HIV result pending  Continue all other maintenance medications.  Follow up plan: Return in about 4 weeks (around 02/06/2024) for WEgovy/BP.   Continue healthy lifestyle choices, including diet (rich in fruits, vegetables, and lean proteins, and low in salt and simple carbohydrates) and exercise (at least 30 minutes of moderate physical activity daily).  Educational handout given for    Clinical References  Obesity, Adult Obesity is having too much body fat. Being obese means that your weight is more than what is healthy for you.  BMI (body mass index) is a number that explains how much body fat you have. If you have a BMI of 30 or more, you are obese. Obesity can cause serious health problems, such as: Stroke. Coronary artery disease (CAD). Type 2 diabetes. Some types of cancer. High blood pressure (hypertension). High cholesterol. Gallbladder stones. Obesity can also contribute to: Osteoarthritis. Sleep apnea. Infertility problems. What are the causes? Eating meals each day that are high in calories, sugar, and fat. Drinking a lot of drinks that have sugar in them. Being born with genes that may make you more likely to become obese. Having a medical condition that causes obesity. Taking certain medicines. Sitting a lot (having a sedentary lifestyle). Not getting enough sleep. What increases the risk? Having a family history of obesity. Living in an area with limited access to: [place], recreation  centers, or sidewalks. Healthy food choices, such as grocery stores and farmers' markets. What are the signs or symptoms? The main sign is having too much body fat. How is this treated? Treatment for this condition often includes changing your lifestyle. Treatment may include: Changing your diet. This may include making a healthy meal plan. Exercise. This may include activity that causes your heart to beat faster (aerobic exercise) and strength training. Work with your doctor to design a program that works for you. Medicine to help you lose weight. This may be used if you are not able to lose one pound a week after 6 weeks of healthy eating and more exercise. Treating conditions that cause the obesity. Surgery. Options may include gastric banding and gastric bypass. This may be done if: Other treatments have not helped to improve your condition. You have a BMI of 40 or higher. You have life-threatening health problems related to obesity. Follow these instructions at home: Eating and drinking  Follow advice from your doctor about what to eat and drink. Your doctor may tell you to: Limit fast food, sweets, and processed snack foods. Choose low-fat options. For example, choose low-fat milk instead of whole milk. Eat five or more servings of fruits or vegetables each day. Eat at home more often. This gives you more control over what you eat. Choose healthy foods when you eat out. Learn to read food labels. This will help you learn how much food is in one serving. Keep low-fat snacks available. Avoid drinks that have a lot of sugar in them. These include soda, fruit  juice, iced tea with sugar, and flavored milk. Drink enough water to keep your pee (urine) pale yellow. Do not go on fad diets. Physical activity Exercise often, as told by your doctor. Most adults should get up to 150 minutes of moderate-intensity exercise every week.Ask your doctor: What types of exercise are safe for you. How  often you should exercise. Warm up and stretch before being active. Do slow stretching after being active (cool down). Rest between times of being active. Lifestyle Work with your doctor and a food expert (dietitian) to set a weight-loss goal that is best for you. Limit your screen time. Find ways to reward yourself that do not involve food. Do not drink alcohol if: Your doctor tells you not to drink. You are pregnant, may be pregnant, or are planning to become pregnant. If you drink alcohol: Limit how much you have to: 0-1 drink a day for women. 0-2 drinks a day for men. Know how much alcohol is in your drink. In the [place] one drink equals one 12 oz bottle of beer (355 mL), one 5 oz glass of wine (148 mL), or one 1 oz glass of hard liquor (44 mL). General instructions Keep a weight-loss journal. This can help you keep track of: The food that you eat. How much exercise you get. Take over-the-counter and prescription medicines only as told by your doctor. Take vitamins and supplements only as told by your doctor. Think about joining a support group. Pay attention to your mental health as obesity can lead to depression or self esteem issues. Keep all follow-up visits. Contact a doctor if: You cannot meet your weight-loss goal after you have changed your diet and lifestyle for 6 weeks. You are having trouble breathing. Summary Obesity is having too much body fat. Being obese means that your weight is more than what is healthy for you. Work with your doctor to set a weight-loss goal. Get regular exercise as told by your doctor. This information is not intended to replace advice given to you by your health care provider. Make sure you discuss any questions you have with your health care provider. Document Revised: 11/04/2020 Document Reviewed: 11/04/2020 Elsevier Patient Education  2024 [employer] Health Maintenance, Male Adopting a healthy lifestyle and getting preventive care  are important in promoting health and wellness. Ask your health care provider about: The right schedule for you to have regular tests and exams. Things you can do on your own to prevent diseases and keep yourself healthy. What should I know about diet, weight, and exercise? Eat a healthy diet  Eat a diet that includes plenty of vegetables, fruits, low-fat dairy products, and lean protein. Do not eat a lot of foods that are high in solid fats, added sugars, or sodium. Maintain a healthy weight Body mass index (BMI) is a measurement that can be used to identify possible weight problems. It estimates body fat based on height and weight. Your health care provider can help determine your BMI and help you achieve or maintain a healthy weight. Get regular exercise Get regular exercise. This is one of the most important things you can do for your health. Most adults should: Exercise for at least 150 minutes each week. The exercise should increase your heart rate and make you sweat (moderate-intensity exercise). Do strengthening exercises at least twice a week. This is in addition to the moderate-intensity exercise. Spend less time sitting. Even light physical activity can be beneficial. Watch cholesterol and blood lipids Have  your blood tested for lipids and cholesterol at 45 years of age, then have this test every 5 years. You may need to have your cholesterol levels checked more often if: Your lipid or cholesterol levels are high. You are older than 45 years of age. You are at high risk for heart disease. What should I know about cancer screening? Many types of cancers can be detected early and may often be prevented. Depending on your health history and family history, you may need to have cancer screening at various ages. This may include screening for: Colorectal cancer. Prostate cancer. Skin cancer. Lung cancer. What should I know about heart disease, diabetes, and high blood  pressure? Blood pressure and heart disease High blood pressure causes heart disease and increases the risk of stroke. This is more likely to develop in people who have high blood pressure readings or are overweight. Talk with your health care provider about your target blood pressure readings. Have your blood pressure checked: Every 3-5 years if you are 12-60 years of age. Every year if you are 48 years old or older. If you are between the ages of 65 and 68 and are a current or former smoker, ask your health care provider if you should have a one-time screening for abdominal aortic aneurysm (AAA). Diabetes Have regular diabetes screenings. This checks your fasting blood sugar level. Have the screening done: Once every three years after age 3 if you are at a normal weight and have a low risk for diabetes. More often and at a younger age if you are overweight or have a high risk for diabetes. What should I know about preventing infection? Hepatitis B If you have a higher risk for hepatitis B, you should be screened for this virus. Talk with your health care provider to find out if you are at risk for hepatitis B infection. Hepatitis C Blood testing is recommended for: Everyone born from [birth year] through 1967. Anyone with known risk factors for hepatitis C. Sexually transmitted infections (STIs) You should be screened each year for STIs, including gonorrhea and chlamydia, if: You are sexually active and are younger than 45 years of age. You are older than 45 years of age and your health care provider tells you that you are at risk for this type of infection. Your sexual activity has changed since you were last screened, and you are at increased risk for chlamydia or gonorrhea. Ask your health care provider if you are at risk. Ask your health care provider about whether you are at high risk for HIV. Your health care provider may recommend a prescription medicine to help prevent HIV infection. If you  choose to take medicine to prevent HIV, you should first get tested for HIV. You should then be tested every 3 months for as long as you are taking the medicine. Follow these instructions at home: Alcohol use Do not drink alcohol if your health care provider tells you not to drink. If you drink alcohol: Limit how much you have to 0-2 drinks a day. Know how much alcohol is in your drink. In the [place] one drink equals one 12 oz bottle of beer (355 mL), one 5 oz glass of wine (148 mL), or one 1 oz glass of hard liquor (44 mL). Lifestyle Do not use any products that contain nicotine or tobacco. These products include cigarettes, chewing tobacco, and vaping devices, such as e-cigarettes. If you need help quitting, ask your health care provider. Do not use street  drugs. Do not share needles. Ask your health care provider for help if you need support or information about quitting drugs. General instructions Schedule regular health, dental, and eye exams. Stay current with your vaccines. Tell your health care provider if: You often feel depressed. You have ever been abused or do not feel safe at home. Summary Adopting a healthy lifestyle and getting preventive care are important in promoting health and wellness. Follow your health care provider's instructions about healthy diet, exercising, and getting tested or screened for diseases. Follow your health care provider's instructions on monitoring your cholesterol and blood pressure. This information is not intended to replace advice given to you by your health care provider. Make sure you discuss any questions you have with your health care provider. Document Revised: 08/18/2020 Document Reviewed: 08/18/2020 Elsevier Patient Education  2024 [employer]. Managing Your Hypertension Hypertension, also called high blood pressure, is when the force of the blood pressing against the walls of the arteries is too strong. Arteries are blood vessels that  carry blood from your heart throughout your body. Hypertension forces the heart to work harder to pump blood and may cause the arteries to become narrow or stiff. Understanding blood pressure readings A blood pressure reading includes a higher number over a lower number: The first, or top, number is called the systolic pressure. It is a measure of the pressure in your arteries as your heart beats. The second, or bottom number, is called the diastolic pressure. It is a measure of the pressure in your arteries as the heart relaxes. For most people, a normal blood pressure is below 120/80. Your personal target blood pressure may vary depending on your medical conditions, your age, and other factors. Blood pressure is classified into four stages. Based on your blood pressure reading, your health care provider may use the following stages to determine what type of treatment you need, if any. Systolic pressure and diastolic pressure are measured in a unit called millimeters of mercury (mmHg). Normal Systolic pressure: below 120. Diastolic pressure: below 80. Elevated Systolic pressure: 120-129. Diastolic pressure: below 80. Hypertension stage 1 Systolic pressure: 130-139. Diastolic pressure: 80-89. Hypertension stage 2 Systolic pressure: 140 or above. Diastolic pressure: 90 or above. How can this condition affect me? Managing your hypertension is very important. Over time, hypertension can damage the arteries and decrease blood flow to parts of the body, including the brain, heart, and kidneys. Having untreated or uncontrolled hypertension can lead to: A heart attack. A stroke. A weakened blood vessel (aneurysm). Heart failure. Kidney damage. Eye damage. Memory and concentration problems. Vascular dementia. What actions can I take to manage this condition? Hypertension can be managed by making lifestyle changes and possibly by taking medicines. Your health care provider will help you make a  plan to bring your blood pressure within a normal range. You may be referred for counseling on a healthy diet and physical activity. Nutrition  Eat a diet that is high in fiber and potassium, and low in salt (sodium), added sugar, and fat. An example eating plan is called the DASH diet. DASH stands for Dietary Approaches to Stop Hypertension. To eat this way: Eat plenty of fresh fruits and vegetables. Try to fill one-half of your plate at each meal with fruits and vegetables. Eat whole grains, such as whole-wheat pasta, brown rice, or whole-grain bread. Fill about one-fourth of your plate with whole grains. Eat low-fat dairy products. Avoid fatty cuts of meat, processed or cured meats, and poultry  with skin. Fill about one-fourth of your plate with lean proteins such as fish, chicken without skin, beans, eggs, and tofu. Avoid pre-made and processed foods. These tend to be higher in sodium, added sugar, and fat. Reduce your daily sodium intake. Many people with hypertension should eat less than 1,500 mg of sodium a day. Lifestyle  Work with your health care provider to maintain a healthy body weight or to lose weight. Ask what an ideal weight is for you. Get at least 30 minutes of exercise that causes your heart to beat faster (aerobic exercise) most days of the week. Activities may include walking, swimming, or biking. Include exercise to strengthen your muscles (resistance exercise), such as weight lifting, as part of your weekly exercise routine. Try to do these types of exercises for 30 minutes at least 3 days a week. Do not use any products that contain nicotine or tobacco. These products include cigarettes, chewing tobacco, and vaping devices, such as e-cigarettes. If you need help quitting, ask your health care provider. Control any long-term (chronic) conditions you have, such as high cholesterol or diabetes. Identify your sources of stress and find ways to manage stress. This may include  meditation, deep breathing, or making time for fun activities. Alcohol use Do not drink alcohol if: Your health care provider tells you not to drink. You are pregnant, may be pregnant, or are planning to become pregnant. If you drink alcohol: Limit how much you have to: 0-1 drink a day for women. 0-2 drinks a day for men. Know how much alcohol is in your drink. In the [place] one drink equals one 12 oz bottle of beer (355 mL), one 5 oz glass of wine (148 mL), or one 1 oz glass of hard liquor (44 mL). Medicines Your health care provider may prescribe medicine if lifestyle changes are not enough to get your blood pressure under control and if: Your systolic blood pressure is 130 or higher. Your diastolic blood pressure is 80 or higher. Take medicines only as told by your health care provider. Follow the directions carefully. Blood pressure medicines must be taken as told by your health care provider. The medicine does not work as well when you skip doses. Skipping doses also puts you at risk for problems. Monitoring Before you monitor your blood pressure: Do not smoke, drink caffeinated beverages, or exercise within 30 minutes before taking a measurement. Use the bathroom and empty your bladder (urinate). Sit quietly for at least 5 minutes before taking measurements. Monitor your blood pressure at home as told by your health care provider. To do this: Sit with your back straight and supported. Place your feet flat on the floor. Do not cross your legs. Support your arm on a flat surface, such as a table. Make sure your upper arm is at heart level. Each time you measure, take two or three readings one minute apart and record the results. You may also need to have your blood pressure checked regularly by your health care provider. General information Talk with your health care provider about your diet, exercise habits, and other lifestyle factors that may be contributing to hypertension. Review  all the medicines you take with your health care provider because there may be side effects or interactions. Keep all follow-up visits. Your health care provider can help you create and adjust your plan for managing your high blood pressure. Where to find more information National Heart, Lung, and Blood Institute: PopSteam.is American Heart Association: www.heart.org Contact a  health care provider if: You think you are having a reaction to medicines you have taken. You have repeated (recurrent) headaches. You feel dizzy. You have swelling in your ankles. You have trouble with your vision. Get help right away if: You develop a severe headache or confusion. You have unusual weakness or numbness, or you feel faint. You have severe pain in your chest or abdomen. You vomit repeatedly. You have trouble breathing. These symptoms may be an emergency. Get help right away. Call 911. Do not wait to see if the symptoms will go away. Do not drive yourself to the hospital. Summary Hypertension is when the force of blood pumping through your arteries is too strong. If this condition is not controlled, it may put you at risk for serious complications. Your personal target blood pressure may vary depending on your medical conditions, your age, and other factors. For most people, a normal blood pressure is less than 120/80. Hypertension is managed by lifestyle changes, medicines, or both. Lifestyle changes to help manage hypertension include losing weight, eating a healthy, low-sodium diet, exercising more, stopping smoking, and limiting alcohol. This information is not intended to replace advice given to you by your health care provider. Make sure you discuss any questions you have with your health care provider. Document Revised: 12/11/2020 Document Reviewed: 12/11/2020 Elsevier Patient Education  2024 Elsevier Inc. Preventing Hypertension Hypertension, also called high blood pressure, is when  the force of blood pumping through the arteries is too strong. Arteries are blood vessels that carry blood from the heart throughout the body. Often, hypertension does not cause symptoms until blood pressure is very high. It is important to have your blood pressure checked regularly. Diet and lifestyle changes can help you prevent hypertension, and they may make you feel better overall and improve your quality of life. If you already have hypertension, you may control it with diet and lifestyle changes, as well as with medicine. How can this condition affect me? Over time, hypertension can damage the arteries and decrease blood flow to important parts of the body, including the brain, heart, and kidneys. By keeping your blood pressure in a healthy range, you can help prevent complications like heart attack, heart failure, stroke, kidney failure, and vascular dementia. What can increase my risk? An unhealthy diet and a lack of physical activity can make you more likely to develop high blood pressure. Some other risk factors include: Age. The risk increases with age. Having family members who have had high blood pressure. Having certain health conditions, such as thyroid problems. Being overweight or obese. Drinking too much alcohol or caffeine. Having too much fat, sugar, calories, or salt (sodium) in your diet. Smoking or using illegal drugs. Taking certain medicines, such as antidepressants, decongestants, birth control pills, and NSAIDs, such as ibuprofen. What actions can I take to prevent or manage this condition? Work with your health care provider to make a hypertension prevention plan that works for you. You may be referred for counseling on a healthy diet and physical activity. Follow your plan and keep all follow-up visits. Diet changes Maintain a healthy diet. This includes: Eating less salt (sodium). Ask your health care provider how much sodium is safe for you to have. The general  recommendation is to have less than 1 tsp (2,300 mg) of sodium a day. Do not add salt to your food. Choose low-sodium options when grocery shopping and eating out. Limiting fats in your diet. You can do this by eating low-fat  or fat-free dairy products and by eating less red meat. Eating more fruits, vegetables, and whole grains. Make a goal to eat: 1-2 cups of fresh fruits and vegetables each day. 3-4 servings of whole grains each day. Avoiding foods and beverages that have added sugars. Eating fish that contain healthy fats (omega-3 fatty acids), such as mackerel or salmon. If you need help putting together a healthy eating plan, try the DASH diet. This diet is high in fruits, vegetables, and whole grains. It is low in sodium, red meat, and added sugars. DASH stands for Dietary Approaches to Stop Hypertension. Lifestyle changes  Lose weight if you are overweight. Losing just 3-5% of your body weight can help prevent or control hypertension. For example, if your present weight is 200 lb (91 kg), a loss of 3-5% of your weight means losing 6-10 lb (2.7-4.5 kg). Ask your health care provider to help you with a diet and exercise plan to safely lose weight. Get enough exercise. Do at least 150 minutes of moderate-intensity exercise each week. You could do this in short exercise sessions several times a day, or you could do longer exercise sessions a few times a week. For example, you could take a brisk 10-minute walk or bike ride, 3 times a day, for 5 days a week. Find ways to reduce stress, such as exercising, meditating, listening to music, or taking a yoga class. If you need help reducing stress, ask your health care provider. Do not use any products that contain nicotine or tobacco. These products include cigarettes, chewing tobacco, and vaping devices, such as e-cigarettes. Chemicals in tobacco and nicotine products raise your blood pressure each time you use them. If you need help quitting, ask  your health care provider. Learn how to check your blood pressure at home. Make sure that you know your personal target blood pressure, as told by your health care provider. Try to sleep 7-9 hours per night. Alcohol use Do not drink alcohol if: Your health care provider tells you not to drink. You are pregnant, may be pregnant, or are planning to become pregnant. If you drink alcohol: Limit how much you have to: 0-1 drink a day for women. 0-2 drinks a day for men. Know how much alcohol is in your drink. In the [place] one drink equals one 12 oz bottle of beer (355 mL), one 5 oz glass of wine (148 mL), or one 1 oz glass of hard liquor (44 mL). Medicines In addition to diet and lifestyle changes, your health care provider may recommend medicines to help lower your blood pressure. In general: You may need to try a few different medicines to find what works best for you. You may need to take more than one medicine. Take over-the-counter and prescription medicines only as told by your health care provider. Questions to ask your health care provider What is my blood pressure goal? How can I lower my risk for high blood pressure? How should I monitor my blood pressure at home? Where to find support Your health care provider can help you prevent hypertension and help you keep your blood pressure at a healthy level. Your local hospital or your community may also provide support services and prevention programs. The American Heart Association offers an online support network at supportnetwork.heart.org Where to find more information Learn more about hypertension from: National Heart, Lung, and Blood Institute: PopSteam.is Centers for Disease Control and Prevention: FootballExhibition.com.br American Academy of Family Physicians: familydoctor.org Learn more about the DASH  diet from: National Heart, Lung, and Blood Institute: PopSteam.is Contact a health care provider if: You think you are having a  reaction to medicines you have taken. You have recurrent headaches or feel dizzy. You have swelling in your ankles. You have trouble with your vision. Get help right away if: You have sudden, severe chest, back, or abdominal pain or discomfort. You have shortness of breath. You have a sudden, severe headache. These symptoms may be an emergency. Get help right away. Call 911. Do not wait to see if the symptoms will go away. Do not drive yourself to the hospital. Summary Hypertension often does not cause any symptoms until blood pressure is very high. It is important to get your blood pressure checked regularly. Diet and lifestyle changes are important steps in preventing hypertension. By keeping your blood pressure in a healthy range, you may prevent complications like heart attack, heart failure, stroke, and kidney failure. Work with your health care provider to make a hypertension prevention plan that works for you. This information is not intended to replace advice given to you by your health care provider. Make sure you discuss any questions you have with your health care provider. Document Revised: 01/15/2021 Document Reviewed: 01/15/2021 Elsevier Patient Education  2024 Elsevier Inc. Managing Depression, Adult Depression is a mental health condition that affects your thoughts, feelings, and actions. Being diagnosed with depression can bring you relief if you did not know why you have felt or behaved a certain way. It could also leave you feeling overwhelmed. Finding ways to manage your symptoms can help you feel more positive about your future. How to manage lifestyle changes Being depressed is difficult. Depression can increase the level of everyday stress. Stress can make depression symptoms worse. You may believe your symptoms cannot be managed or will never improve. However, there are many things you can try to help manage your symptoms. There is hope. Managing stress  Stress is  your body's reaction to life changes and events, both good and bad. Stress can add to your feelings of depression. Learning to manage your stress can help lessen your feelings of depression. Try some of the following approaches to reducing your stress (stress reduction techniques): Listen to music that you enjoy and that inspires you. Try using a meditation app or take a meditation class. Develop a practice that helps you connect with your spiritual self. Walk in nature, pray, or go to a place of worship. Practice deep breathing. To do this, inhale slowly through your nose. Pause at the top of your inhale for a few seconds and then exhale slowly, letting yourself relax. Repeat this three or four times. Practice yoga to help relax and work your muscles. Choose a stress reduction technique that works for you. These techniques take time and practice to develop. Set aside 5-15 minutes a day to do them. Therapists can offer training in these techniques. Do these things to help manage stress: Keep a journal. Know your limits. Set healthy boundaries for yourself and others, such as saying no when you think something is too much. Pay attention to how you react to certain situations. You may not be able to control everything, but you can change your reaction. Add humor to your life by watching funny movies or shows. Make time for activities that you enjoy and that relax you. Spend less time using electronics, especially at night before bed. The light from screens can make your brain think it is time to get up  rather than go to bed.   Medicines Medicines, such as antidepressants, are often a part of treatment for depression. Talk with your pharmacist or health care provider about all the medicines, supplements, and herbal products that you take, their possible side effects, and what medicines and other products are safe to take together. Make sure to report any side effects you may have to your health care  provider. Relationships Your health care provider may suggest family therapy, couples therapy, or individual therapy as part of your treatment. How to recognize changes Everyone responds differently to treatment for depression. As you recover from depression, you may start to: Have more interest in doing activities. Feel more hopeful. Have more energy. Eat a more regular amount of food. Have better mental focus. It is important to recognize if your depression is not getting better or is getting worse. The symptoms you had in the beginning may return, such as: Feeling tired. Eating too much or too little. Sleeping too much or too little. Feeling restless, agitated, or hopeless. Trouble focusing or making decisions. Having unexplained aches and pains. Feeling irritable, angry, or aggressive. If you or your family members notice these symptoms coming back, let your health care provider know right away. Follow these instructions at home: Activity Try to get some form of exercise each day, such as walking. Try yoga, mindfulness, or other stress reduction techniques. Participate in group activities if you are able. Lifestyle Get enough sleep. Cut down on or stop using caffeine, tobacco, alcohol, and any other harmful substances. Eat a healthy diet that includes plenty of vegetables, fruits, whole grains, low-fat dairy products, and lean protein. Limit foods that are high in solid fats, added sugar, or salt (sodium). General instructions Take over-the-counter and prescription medicines only as told by your health care provider. Keep all follow-up visits. It is important for your health care provider to check on your mood, behavior, and medicines. Your health care provider may need to make changes to your treatment. Where to find support Talking to others  Friends and family members can be sources of support and guidance. Talk to trusted friends or family members about your condition.  Explain your symptoms and let them know that you are working with a health care provider to treat your depression. Tell friends and family how they can help. Finances Find mental health providers that fit with your financial situation. Talk with your health care provider if you are worried about access to food, housing, or medicine. Call your insurance company to learn about your co-pays and prescription plan. Where to find more information You can find support in your area from: Anxiety and Depression Association of America (ADAA): adaa.org Mental Health America: mentalhealthamerica.net [employer] on Mental Illness: nami.org Contact a health care provider if: You stop taking your antidepressant medicines, and you have any of these symptoms: Nausea. Headache. Light-headedness. Chills and body aches. Not being able to sleep (insomnia). You or your friends and family think your depression is getting worse. Get help right away if: You have thoughts of hurting yourself or others. Get help right away if you feel like you may hurt yourself or others, or have thoughts about taking your own life. Go to your nearest emergency room or: Call 911. Call the National Suicide Prevention Lifeline at 2082176887 or 988. This is open 24 hours a day. Text the Crisis Text Line at (562)231-3396. This information is not intended to replace advice given to you by your health care provider. Make  sure you discuss any questions you have with your health care provider. Document Revised: 08/04/2021 Document Reviewed: 08/04/2021 Elsevier Patient Education  2024 Elsevier Inc. Major Depressive Disorder, Adult Major depressive disorder (MDD) is a mental health condition. People with this disorder feel very sad, hopeless, and lose interest in things. Symptoms last most of the day, almost every day, for 2 weeks. MDD can affect: Relationships. Work and school. Things you usually like to do. What are the  causes? The cause of MDD is not known. What increases the risk? Having family members with depression. Being male. Family problems. Alcohol or drug misuse. A lot of stress in your life, such as from: Living without basic needs such as food and housing. Being treated poorly because of race, sex, or religion (discrimination). Things that caused you pain as a child, especially if you lost a parent or were abused. Health and mental problems that you have had for a long time. What are the signs or symptoms? The main symptoms of this condition are: Being sad all the time. Being grouchy (irritable) all the time. Not enjoying the things you usually like. Sleeping too much or too little. Eating too much or too little. Feeling tired. Other symptoms include: Gaining or losing weight, without knowing why. Being restless and weak. Feeling hopeless, worthless, or guilty. Trouble thinking or making decisions. Thoughts of hurting yourself or others, or thoughts of ending your life. Spending a lot of time alone. Being unable to do daily tasks. If you have very bad MDD, you may: Believe things that are not true. Hear, see, taste, or feel things that are not there. Have mild depression that lasts for at least 2 years. Feel very sad and hopeless. Have trouble speaking or moving. Feel very sad during some seasons. How is this treated? Talk therapy. This teaches you about thoughts, feelings, and actions and how to change them. This can also help you to talk with others. This can be done with members of your family. Medicines. Lifestyle changes. You may need to: Limit alcohol use. Stop using drugs, if you use them. Exercise. Get plenty of sleep. Eat healthy. Spend more time outdoors. Brain stimulation. This may be done when symptoms are very bad or have not gotten better. Follow these instructions at home: Alcohol use Do not drink alcohol if: Your health care provider tells you not to  drink. You are pregnant, may be pregnant, or are planning to become pregnant. If you drink alcohol: Limit how much you use to: 0-1 drink a day for women. 0-2 drinks a day for men. Know how much alcohol is in your drink. In the [place] one drink equals one 12 oz bottle of beer (355 mL), one 5 oz glass of wine (148 mL), or one 1 oz glass of hard liquor (44 mL). Activity Exercise as told by your doctor. Spend time outdoors. Make time to do the things you enjoy. Find ways to deal with stress. Try to: Meditate. Do deep breathing. Spend time in nature. Keep a journal. Return to your normal activities when your doctor says that it is safe. General instructions  Take over-the-counter and prescription medicines only as told by your doctor. Talk to your doctor about: Alcohol use. It can affect medicines. Any drug use. Eat healthy foods. Get a lot of sleep. Think about joining a support group. Ask your doctor about that. Keep all follow-up visits. Your doctor will need to check on your mood, behavior, and medicines, and change your treatment  as needed. Where to find more information: [employer] on Mental Illness: nami.[employer] of Mental Health: BloggerCourse.com American Psychiatric Association: psychiatry.org Contact a doctor if: You feel worse. You get new symptoms. Get help right away if: You hurt yourself on purpose (self-harm). You have thoughts about hurting yourself or others. You see, hear, taste, smell, or feel things that are not there. Get help right away if you feel like you may hurt yourself or others, or have thoughts about taking your own life. Go to your nearest emergency room or: Call 911. Call the National Suicide Prevention Lifeline at 925-225-0727 or 988. This is open 24 hours a day. Text the Crisis Text Line at (707)599-4862. This information is not intended to replace advice given to you by your health care provider. Make sure you discuss any questions  you have with your health care provider. Document Revised: 08/04/2021 Document Reviewed: 08/04/2021 Elsevier Patient Education  2024 Elsevier Inc. Colonoscopy, Adult A colonoscopy is an exam to look at the large intestine. It is done using a long, thin, flexible tube that has a camera on the end. This exam is done to check for problems, such as: An abnormal growth of cells or tissue (tumor). Abnormal growths within the lining of your intestine (polyps). Irritation and swelling (inflammation). Bleeding. Tell your doctor about: Any allergies you have. All medicines you are taking. Tell him or her about vitamins, herbs, eye drops, creams, and over-the-counter medicines. Any problems you or family members have had with anesthetic medicines. Any bleeding problems you have. Any surgeries you have had. Any medical conditions you have. Any problems you have had with pooping (having bowel movements). Whether you are pregnant or may be pregnant. What are the risks? Generally, this is a safe procedure. However, problems may occur, such as: Bleeding. Damage to your intestine. Allergic reactions to medicines given during the procedure. Infection. This is rare. What happens before the procedure? Eating and drinking Follow instructions from your doctor about eating and drinking. These may include: A few days before the procedure: Follow a low-fiber diet. Avoid these foods: Nuts. Seeds. Dried fruit. Raw fruits. Vegetables. 1-3 days before the procedure: Eat only gelatin dessert or ice pops. Drink only clear liquids, such as: Water. Clear broth or bouillon. Black coffee or tea. Clear juice. Clear soft drinks or sports drinks. Avoid liquids that have red or purple dye. On the day of the procedure: Do not eat solid foods. You may continue to drink clear liquids until up to 2 hours before the procedure. Do not eat or drink anything starting 2 hours before the procedure or as told by your  doctor. Bowel prep If you were prescribed a bowel prep to take by mouth (orally) to clean out your colon: Take it as told by your doctor. Starting the day before your procedure, you will need to drink a lot of liquid medicine. The liquid will cause you to poop until your poop is almost clear or light green. If your skin or butt gets irritated from diarrhea, you may: Wipe the area with wipes that have medicine in them, such as adult wet wipes with aloe and vitamin E. Put something on your skin that soothes the area, such as petroleum jelly. If you vomit while drinking the bowel prep: Take a break for up to 60 minutes. Begin the bowel prep again. Call your doctor if you keep vomiting and you cannot take the bowel prep without vomiting. To clean out your colon, you  may also be given: Laxative medicines. These help you poop. Instructions for using a liquid medicine (enema) injected into your butt. Medicines Ask your doctor about changing or stopping: Your normal medicines. Vitamins, herbs, and supplements. Over-the-counter medicines. Do not take aspirin or ibuprofen unless you are told to. General instructions Ask your doctor what steps will be taken to prevent the spread of germs. These may include washing skin with a soap that kills germs. If you will be going home right after the procedure, plan to have a responsible adult: Take you home from the hospital or clinic. You will not be allowed to drive. Care for you for the time you are told. What happens during the procedure?  An IV tube will be put into one of your veins. You will be given a medicine to make you fall asleep (general anesthetic). You will lie on your side with your knees bent. Oil or gel will be put on the tube. Then the tube will be: Put into the opening of the butt (anus). Gently put into your large intestine. Air will be sent into your colon to keep it open. You may feel some pressure or cramping. The camera will be  used to take photos that will appear on a screen. A small tissue sample may be removed for testing (biopsy). If small growths are found, your doctor may remove them and have them checked for cancer. The tube will be slowly removed. The procedure may vary among doctors and hospitals. What happens after the procedure? You will be monitored until you leave the hospital or clinic. This includes checking your blood pressure, heart rate, breathing rate, and blood oxygen level. You may have a small amount of blood in your poop. You may pass gas. You may have mild cramps or bloating in your belly (abdomen). If you were given a sedative during your procedure, do not drive or use machines until your doctor says that it is safe. It is up to you to get the results of your procedure. Ask how to get your results when they are ready. Summary A colonoscopy is an exam to look at the large intestine. Follow instructions from your doctor about eating and drinking before the procedure. You may be prescribed an oral bowel prep to clean out your colon. Take it as told by your doctor. A flexible tube with a camera on its end will be put into the opening of the butt. It will be passed into the large intestine. This information is not intended to replace advice given to you by your health care provider. Make sure you discuss any questions you have with your health care provider. Document Revised: 03/23/2021 Document Reviewed: 11/19/2020 [employer] Patient Education  2024 [employer]  The above assessment and management plan was discussed with the patient. The patient verbalized understanding of and has agreed to the management plan. Patient is aware to call the clinic if they develop any new symptoms or if symptoms persist or worsen. Patient is aware when to return to the clinic for a follow-up visit. Patient educated on when it is appropriate to go to the emergency department.  Jashira Cotugno [place] Thompson, DNP [facility] [address] [place], [place] [address] [phone]

## 2024-01-09 ENCOUNTER — Ambulatory Visit: Payer: Self-pay | Admitting: Nurse Practitioner

## 2024-01-09 ENCOUNTER — Encounter: Payer: Self-pay | Admitting: Nurse Practitioner

## 2024-01-09 ENCOUNTER — Other Ambulatory Visit: Payer: Self-pay | Admitting: Nurse Practitioner

## 2024-01-09 VITALS — BP 130/89 | HR 91 | Temp 97.4°F | Ht 67.0 in | Wt 276.6 lb

## 2024-01-09 DIAGNOSIS — R351 Nocturia: Secondary | ICD-10-CM | POA: Diagnosis not present

## 2024-01-09 DIAGNOSIS — F321 Major depressive disorder, single episode, moderate: Secondary | ICD-10-CM | POA: Diagnosis not present

## 2024-01-09 DIAGNOSIS — E66813 Obesity, class 3: Secondary | ICD-10-CM

## 2024-01-09 DIAGNOSIS — Z0001 Encounter for general adult medical examination with abnormal findings: Secondary | ICD-10-CM | POA: Insufficient documentation

## 2024-01-09 DIAGNOSIS — Z833 Family history of diabetes mellitus: Secondary | ICD-10-CM

## 2024-01-09 DIAGNOSIS — Z125 Encounter for screening for malignant neoplasm of prostate: Secondary | ICD-10-CM | POA: Insufficient documentation

## 2024-01-09 DIAGNOSIS — I10 Essential (primary) hypertension: Secondary | ICD-10-CM

## 2024-01-09 DIAGNOSIS — Z1211 Encounter for screening for malignant neoplasm of colon: Secondary | ICD-10-CM | POA: Insufficient documentation

## 2024-01-09 DIAGNOSIS — Z6841 Body Mass Index (BMI) 40.0 and over, adult: Secondary | ICD-10-CM

## 2024-01-09 DIAGNOSIS — E669 Obesity, unspecified: Secondary | ICD-10-CM | POA: Insufficient documentation

## 2024-01-09 MED ORDER — PANTOPRAZOLE SODIUM 20 MG PO TBEC: 20.0000 mg | DELAYED_RELEASE_TABLET | Freq: Every day | ORAL | 0 refills | Status: DC

## 2024-01-09 MED ORDER — WEGOVY 0.25 MG/0.5ML ~~LOC~~ SOAJ: 0.2500 mg | SUBCUTANEOUS | 0 refills | Status: DC

## 2024-01-09 MED ORDER — VALSARTAN-HYDROCHLOROTHIAZIDE 80-12.5 MG PO TABS: 1.0000 | ORAL_TABLET | Freq: Every day | ORAL | 0 refills | Status: DC

## 2024-01-10 ENCOUNTER — Ambulatory Visit: Payer: Self-pay | Admitting: Nurse Practitioner

## 2024-01-10 DIAGNOSIS — R7989 Other specified abnormal findings of blood chemistry: Secondary | ICD-10-CM

## 2024-01-10 LAB — CBC WITH DIFFERENTIAL/PLATELET
Basophils Absolute: 0.1 x10E3/uL (ref 0.0–0.2)
Basos: 1 %
EOS (ABSOLUTE): 0.1 x10E3/uL (ref 0.0–0.4)
Eos: 1 %
Hematocrit: 45.2 % (ref 37.5–51.0)
Hemoglobin: 15.2 g/dL (ref 13.0–17.7)
Immature Grans (Abs): 0.3 x10E3/uL — ABNORMAL HIGH (ref 0.0–0.1)
Immature Granulocytes: 2 %
Lymphocytes Absolute: 3.7 x10E3/uL — ABNORMAL HIGH (ref 0.7–3.1)
Lymphs: 25 %
MCH: 31.1 pg (ref 26.6–33.0)
MCHC: 33.6 g/dL (ref 31.5–35.7)
MCV: 92 fL (ref 79–97)
Monocytes Absolute: 1.4 x10E3/uL — ABNORMAL HIGH (ref 0.1–0.9)
Monocytes: 9 %
Neutrophils Absolute: 9.4 x10E3/uL — ABNORMAL HIGH (ref 1.4–7.0)
Neutrophils: 62 %
Platelets: 310 x10E3/uL (ref 150–450)
RBC: 4.89 x10E6/uL (ref 4.14–5.80)
RDW: 13 % (ref 11.6–15.4)
WBC: 15.1 x10E3/uL — ABNORMAL HIGH (ref 3.4–10.8)

## 2024-01-10 LAB — HEPB+HEPC+HIV PANEL
HIV Screen 4th Generation wRfx: NONREACTIVE
Hep B C IgM: NEGATIVE
Hep B Core Total Ab: NEGATIVE
Hep B E Ab: NONREACTIVE
Hep B E Ag: NEGATIVE
Hep B Surface Ab, Qual: NONREACTIVE
Hep C Virus Ab: NONREACTIVE
Hepatitis B Surface Ag: NEGATIVE

## 2024-01-10 LAB — CMP14+EGFR
ALT: 34 IU/L (ref 0–44)
AST: 14 IU/L (ref 0–40)
Albumin: 4 g/dL — ABNORMAL LOW (ref 4.1–5.1)
Alkaline Phosphatase: 89 IU/L (ref 47–123)
BUN/Creatinine Ratio: 16 (ref 9–20)
BUN: 14 mg/dL (ref 6–24)
Bilirubin Total: <0.2 mg/dL (ref 0.0–1.2)
CO2: 24 mmol/L (ref 20–29)
Calcium: 9 mg/dL (ref 8.7–10.2)
Chloride: 104 mmol/L (ref 96–106)
Creatinine, Ser: 0.89 mg/dL (ref 0.76–1.27)
Globulin, Total: 2.7 g/dL (ref 1.5–4.5)
Glucose: 104 mg/dL — ABNORMAL HIGH (ref 70–99)
Potassium: 4.1 mmol/L (ref 3.5–5.2)
Sodium: 140 mmol/L (ref 134–144)
Total Protein: 6.7 g/dL (ref 6.0–8.5)
eGFR: 108 mL/min/1.73 (ref >59)

## 2024-01-10 LAB — LIPID PANEL
Chol/HDL Ratio: 2.9 ratio (ref 0.0–5.0)
Cholesterol, Total: 138 mg/dL (ref 100–199)
HDL: 47 mg/dL (ref >39)
LDL Chol Calc (NIH): 72 mg/dL (ref 0–99)
Triglycerides: 105 mg/dL (ref 0–149)
VLDL Cholesterol Cal: 19 mg/dL (ref 5–40)

## 2024-01-10 LAB — THYROID PANEL WITH TSH
Free Thyroxine Index: 1.6 (ref 1.2–4.9)
T3 Uptake Ratio: 27 % (ref 24–39)
T4, Total: 5.8 ug/dL (ref 4.5–12.0)
TSH: 6.75 u[IU]/mL — ABNORMAL HIGH (ref 0.450–4.500)

## 2024-01-10 LAB — PSA, TOTAL AND FREE
PSA, Free Pct: 18 %
PSA, Free: 0.09 ng/mL
Prostate Specific Ag, Serum: 0.5 ng/mL (ref 0.0–4.0)

## 2024-01-10 LAB — BAYER DCA HB A1C WAIVED: HB A1C (BAYER DCA - WAIVED): 5.6 % (ref 4.8–5.6)

## 2024-01-10 NOTE — Telephone Encounter (Signed)
 Name from pharmacy: [facility] 0.25MG        INJ  Pharmacy comment: product not covered

## 2024-01-18 ENCOUNTER — Ambulatory Visit: Admitting: Nurse Practitioner

## 2024-01-18 VITALS — BP 121/77 | HR 100 | Temp 97.8°F | Ht 67.0 in | Wt 274.4 lb

## 2024-01-18 DIAGNOSIS — I10 Essential (primary) hypertension: Secondary | ICD-10-CM | POA: Diagnosis not present

## 2024-01-18 DIAGNOSIS — R0789 Other chest pain: Secondary | ICD-10-CM

## 2024-01-18 DIAGNOSIS — Z23 Encounter for immunization: Secondary | ICD-10-CM | POA: Diagnosis not present

## 2024-01-18 NOTE — Addendum Note (Signed)
 Addended by: DEITRA MORTON SEBASTIAN NENA on: 01/18/2024 04:53 PM   Modules accepted: Level of Service

## 2024-01-18 NOTE — Progress Notes (Addendum)
 Subjective:  Patient ID: Robert Herman, male    DOB: 07/14/1980, 43 y.o.   MRN: [id number]  Patient Care Team: Deitra Morton Sebastian Nena, NP as PCP - General (Nurse Practitioner)   Chief Complaint:  Hypertension   HPI: Robert Herman is a 43 y.o. male presenting on 01/18/2024 for Hypertension   Discussed the use of AI scribe software for clinical note transcription with the patient, who gave verbal consent to proceed.  History of Present Illness Robert Herman is a 43 year old male with hypertension who presents with chest pressure.  He is here for a follow-up on his hypertension. He was last seen in September 2029 for hypertension follow-up; the specific blood pressure value at that visit is not clearly stated. He was started on Diovan 80-12.5 mg, one tablet daily. He has been monitoring his blood pressure at home, with readings averaging from 141/103 mmHg, although these readings may not be accurate due to the use of a wrist cuff.  He experiences dizziness intermittently over the past few days. No palpitations, but he describes a sensation of chest pressure, likened to 'somebody's sitting on my chest,' which has been present for about two days. He rates this pressure as an 8 out of 10 in severity. The chest pressure does not radiate to other areas.  Plan to order EKG to rule out possible heart attack  He mentions experiencing stress related to personal family issues, particularly concerning his daughter, which he believes may be contributing to his symptoms.      Relevant past medical, surgical, family, and social history reviewed and updated as indicated.  Allergies and medications reviewed and updated. Data reviewed: Chart in Epic.   History reviewed. No pertinent past medical history.  History reviewed. No pertinent surgical history.  Social History   Socioeconomic History   Marital status: Single    Spouse name: Not on file   Number of children: Not on file   Years  of education: Not on file   Highest education level: 10th grade  Occupational History   Not on file  Tobacco Use   Smoking status: Every Day    Current packs/day: 0.50    Types: Cigarettes   Smokeless tobacco: Former    Types: Snuff  Vaping Use   Vaping status: Every Day  Substance and Sexual Activity   Alcohol use: No   Drug use: Never   Sexual activity: Yes  Other Topics Concern   Not on file  Social History Narrative   Not on file   Social Drivers of Health   Financial Resource Strain: Medium Risk (01/18/2024)   Overall Financial Resource Strain (CARDIA)    Difficulty of Paying Living Expenses: Somewhat hard  Food Insecurity: Food Insecurity Present (01/18/2024)   Hunger Vital Sign    Worried About Running Out of Food in the Last Year: Not on file    Ran Out of Food in the Last Year: Sometimes true  Transportation Needs: No Transportation Needs (01/18/2024)   PRAPARE - Administrator, Civil Service (Medical): No    Lack of Transportation (Non-Medical): No  Physical Activity: Insufficiently Active (01/18/2024)   Exercise Vital Sign    Days of Exercise per Week: 7 days    Minutes of Exercise per Session: 10 min  Stress: Stress Concern Present (01/18/2024)   [employer] of Occupational Health - Occupational Stress Questionnaire    Feeling of Stress: To some extent  Social Connections: Moderately  Isolated (01/18/2024)   Social Connection and Isolation Panel    Frequency of Communication with Friends and Family: More than three times a week    Frequency of Social Gatherings with Friends and Family: Three times a week    Attends Religious Services: More than 4 times per year    Active Member of Clubs or Organizations: No    Attends Banker Meetings: Not on file    Marital Status: Divorced  Intimate Partner Violence: Not on file    Outpatient Encounter Medications as of 01/18/2024  Medication Sig   albuterol (VENTOLIN HFA) 108 (90 Base) MCG/ACT  inhaler Inhale into the lungs every 6 (six) hours as needed for wheezing or shortness of breath.   busPIRone (BUSPAR) 15 MG tablet Take 10 mg by mouth 2 (two) times daily.   citalopram (CELEXA) 20 MG tablet Take 20 mg by mouth daily.   meloxicam  (MOBIC ) 15 MG tablet Take 15 mg by mouth daily.   pantoprazole (PROTONIX) 20 MG tablet Take 1 tablet (20 mg total) by mouth daily.   semaglutide-weight management (WEGOVY) 0.25 MG/0.5ML SOAJ SQ injection Inject 0.25 mg into the skin every 7 (seven) days.   traZODone (DESYREL) 100 MG tablet Take 100 mg by mouth at bedtime.   valsartan-hydrochlorothiazide (DIOVAN HCT) 80-12.5 MG tablet Take 1 tablet by mouth daily.   predniSONE (DELTASONE) 5 MG tablet Take 5 mg by mouth daily with breakfast.   ranitidine (ZANTAC) 150 MG tablet Take 150 mg by mouth 2 (two) times daily. (Patient not taking: Reported on 01/18/2024)   No facility-administered encounter medications on file as of 01/18/2024.    No Known Allergies  Pertinent ROS per HPI, otherwise unremarkable      Objective:  BP 121/77   Pulse 100   Temp 97.8 F (36.6 C)   Ht 5' 7 (1.702 m)   Wt 274 lb 6.4 oz (124.5 kg)   SpO2 98%   BMI 42.98 kg/m    Wt Readings from Last 3 Encounters:  01/18/24 274 lb 6.4 oz (124.5 kg)  01/09/24 276 lb 9.6 oz (125.5 kg)  02/09/22 250 lb (113.4 kg)   BP Readings from Last 3 Encounters:  01/18/24 121/77  01/09/24 130/89  02/09/22 (!) 151/103     Physical Exam Vitals and nursing note reviewed.  Constitutional:      Appearance: He is obese.  HENT:     Head: Normocephalic and atraumatic.     Nose: Nose normal.  Eyes:     General: No scleral icterus.    Extraocular Movements: Extraocular movements intact.     Conjunctiva/sclera: Conjunctivae normal.     Pupils: Pupils are equal, round, and reactive to light.  Cardiovascular:     Rate and Rhythm: Normal rate and regular rhythm.  Pulmonary:     Effort: Pulmonary effort is normal.     Breath sounds:  Normal breath sounds.  Musculoskeletal:        General: Normal range of motion.     Right lower leg: No edema.     Left lower leg: No edema.  Skin:    General: Skin is warm and dry.     Findings: No rash.  Neurological:     Mental Status: He is oriented to person, place, and time.  Psychiatric:        Mood and Affect: Mood normal.        Behavior: Behavior normal.        Judgment: Judgment normal.  Physical Exam VITALS: P- 100, BP- 121/77   EKG: NSR RRA 91  Results for orders placed or performed in visit on 01/09/24  Bayer DCA Hb A1c Waived   Collection Time: 01/09/24  4:30 PM  Result Value Ref Range   HB A1C (BAYER DCA - WAIVED) 5.6 4.8 - 5.6 %  CBC with Differential/Platelet   Collection Time: 01/09/24  4:36 PM  Result Value Ref Range   WBC 15.1 (H) 3.4 - 10.8 x10E3/uL   RBC 4.89 4.14 - 5.80 x10E6/uL   Hemoglobin 15.2 13.0 - 17.7 g/dL   Hematocrit 54.7 62.4 - 51.0 %   MCV 92 79 - 97 fL   MCH 31.1 26.6 - 33.0 pg   MCHC 33.6 31.5 - 35.7 g/dL   RDW 86.9 88.3 - 84.5 %   Platelets 310 150 - 450 x10E3/uL   Neutrophils 62 Not Estab. %   Lymphs 25 Not Estab. %   Monocytes 9 Not Estab. %   Eos 1 Not Estab. %   Basos 1 Not Estab. %   Neutrophils Absolute 9.4 (H) 1.4 - 7.0 x10E3/uL   Lymphocytes Absolute 3.7 (H) 0.7 - 3.1 x10E3/uL   Monocytes Absolute 1.4 (H) 0.1 - 0.9 x10E3/uL   EOS (ABSOLUTE) 0.1 0.0 - 0.4 x10E3/uL   Basophils Absolute 0.1 0.0 - 0.2 x10E3/uL   Immature Granulocytes 2 Not Estab. %   Immature Grans (Abs) 0.3 (H) 0.0 - 0.1 x10E3/uL  CMP14+EGFR   Collection Time: 01/09/24  4:36 PM  Result Value Ref Range   Glucose 104 (H) 70 - 99 mg/dL   BUN 14 6 - 24 mg/dL   Creatinine, Ser 9.10 0.76 - 1.27 mg/dL   eGFR 891 >40 fO/fpw/8.26   BUN/Creatinine Ratio 16 9 - 20   Sodium 140 134 - 144 mmol/L   Potassium 4.1 3.5 - 5.2 mmol/L   Chloride 104 96 - 106 mmol/L   CO2 24 20 - 29 mmol/L   Calcium 9.0 8.7 - 10.2 mg/dL   Total Protein 6.7 6.0 - 8.5 g/dL    Albumin 4.0 (L) 4.1 - 5.1 g/dL   Globulin, Total 2.7 1.5 - 4.5 g/dL   Bilirubin Total <9.7 0.0 - 1.2 mg/dL   Alkaline Phosphatase 89 47 - 123 IU/L   AST 14 0 - 40 IU/L   ALT 34 0 - 44 IU/L  HepB+HepC+HIV Panel   Collection Time: 01/09/24  4:36 PM  Result Value Ref Range   Hepatitis B Surface Ag Negative Negative   Hep B E Ag Negative Negative   Hep B C IgM Negative Negative   Hep B Core Total Ab Negative Negative   Hep B E Ab Non Reactive Negative   Hep B Surface Ab, Qual Non Reactive    Hep C Virus Ab Non Reactive Non Reactive   HIV Screen 4th Generation wRfx Non Reactive Non Reactive  Lipid panel   Collection Time: 01/09/24  4:36 PM  Result Value Ref Range   Cholesterol, Total 138 100 - 199 mg/dL   Triglycerides 894 0 - 149 mg/dL   HDL 47 >60 mg/dL   VLDL Cholesterol Cal 19 5 - 40 mg/dL   LDL Chol Calc (NIH) 72 0 - 99 mg/dL   Chol/HDL Ratio 2.9 0.0 - 5.0 ratio  Thyroid Panel With TSH   Collection Time: 01/09/24  4:36 PM  Result Value Ref Range   TSH 6.750 (H) 0.450 - 4.500 uIU/mL   T4, Total 5.8 4.5 - 12.0 ug/dL  T3 Uptake Ratio 27 24 - 39 %   Free Thyroxine Index 1.6 1.2 - 4.9  PSA, total and free   Collection Time: 01/09/24  4:36 PM  Result Value Ref Range   Prostate Specific Ag, Serum 0.5 0.0 - 4.0 ng/mL   PSA, Free 0.09 N/A ng/mL   PSA, Free Pct 18.0 %       Pertinent labs & imaging results that were available during my care of the patient were reviewed by me and considered in my medical decision making.  Assessment & Plan:  Ezrael was seen today for hypertension.  Diagnoses and all orders for this visit:  Chest pressure -     EKG 12-Lead  Hypertension, essential  Immunization due -     Flu vaccine trivalent PF, 6mos and older(Flulaval,Afluria,Fluarix,Fluzone)     Assessment and Plan Assessment & Plan Chest pressure, evaluation for possible cardiac etiology Possible cardiac etiology considered. Stress may contribute. - Order EKG. - Refer to ED if  EKG abnormal.  Dizziness, under evaluation Intermittent dizziness with no clear etiology identified.  Essential hypertension Blood pressure well-controlled with current medication. Office reading 121/77 mmHg. Home readings may be inaccurate due to wrist cuff. - Continue Valsartan-hydrochlorothiazide 80-12.5 mg oral daily.  Health maintenance: Flu vaccine administered today    Continue all other maintenance medications.  Follow up plan: Return for follow-up chronic Diseases  Mnagement .   Continue healthy lifestyle choices, including diet (rich in fruits, vegetables, and lean proteins, and low in salt and simple carbohydrates) and exercise (at least 30 minutes of moderate physical activity daily).  Educational handout given for    Clinical References  Chest Wall Pain Chest wall pain is pain in or around the bones and muscles of your chest. Chest wall pain may be caused by: An injury. Coughing a lot. Using your chest and arm muscles too much. Sometimes, the cause may not be known. This pain may take a few weeks or longer to get better. Follow these instructions at home: Managing pain, stiffness, and swelling If told, put ice on the painful area: Put ice in a plastic bag. Place a towel between your skin and the bag. Leave the ice on for 20 minutes, 2-3 times a day.   Activity Rest as told by your doctor. Avoid doing things that cause pain. This includes lifting heavy items. Ask your doctor what activities are safe for you. General instructions  Take over-the-counter and prescription medicines only as told by your doctor. Do not use any products that contain nicotine or tobacco, such as cigarettes, e-cigarettes, and chewing tobacco. If you need help quitting, ask your doctor. Keep all follow-up visits as told by your doctor. This is important. Contact a doctor if: You have a fever. Your chest pain gets worse. You have new symptoms. Get help right away if: You feel sick  to your stomach (nauseous) or you throw up (vomit). You feel sweaty or light-headed. You have a cough with mucus from your lungs (sputum) or you cough up blood. You are short of breath. These symptoms may be an emergency. Do not wait to see if the symptoms will go away. Get medical help right away. Call your local emergency services (911 in the [place] Do not drive yourself to the hospital. Summary Chest wall pain is pain in or around the bones and muscles of your chest. It may be treated with ice, rest, and medicines. Your condition may also get better if you avoid doing things that  cause pain. Contact a doctor if you have a fever, chest pain that gets worse, or new symptoms. Get help right away if you feel light-headed or you get short of breath. These symptoms may be an emergency. This information is not intended to replace advice given to you by your health care provider. Make sure you discuss any questions you have with your health care provider. Document Revised: 03/22/2022 Document Reviewed: 03/22/2022 Elsevier Patient Education  2024 Else[employer] Inc. Hypertension, Adult Hypertension is another name for high blood pressure. High blood pressure forces your heart to work harder to pump blood. This can cause problems over time. There are two numbers in a blood pressure reading. There is a top number (systolic) over a bottom number (diastolic). It is best to have a blood pressure that is below 120/80. What are the causes? The cause of this condition is not known. Some other conditions can lead to high blood pressure. What increases the risk? Some lifestyle factors can make you more likely to develop high blood pressure: Smoking. Not getting enough exercise or physical activity. Being overweight. Having too much fat, sugar, calories, or salt (sodium) in your diet. Drinking too much alcohol. Other risk factors include: Having any of these conditions: Heart disease. Diabetes. High  cholesterol. Kidney disease. Obstructive sleep apnea. Having a family history of high blood pressure and high cholesterol. Age. The risk increases with age. Stress. What are the signs or symptoms? High blood pressure may not cause symptoms. Very high blood pressure (hypertensive crisis) may cause: Headache. Fast or uneven heartbeats (palpitations). Shortness of breath. Nosebleed. Vomiting or feeling like you may vomit (nauseous). Changes in how you see. Very bad chest pain. Feeling dizzy. Seizures. How is this treated? This condition is treated by making healthy lifestyle changes, such as: Eating healthy foods. Exercising more. Drinking less alcohol. Your doctor may prescribe medicine if lifestyle changes do not help enough and if: Your top number is above 130. Your bottom number is above 80. Your personal target blood pressure may vary. Follow these instructions at home: Eating and drinking  If told, follow the DASH eating plan. To follow this plan: Fill one half of your plate at each meal with fruits and vegetables. Fill one fourth of your plate at each meal with whole grains. Whole grains include whole-wheat pasta, brown rice, and whole-grain bread. Eat or drink low-fat dairy products, such as skim milk or low-fat yogurt. Fill one fourth of your plate at each meal with low-fat (lean) proteins. Low-fat proteins include fish, chicken without skin, eggs, beans, and tofu. Avoid fatty meat, cured and processed meat, or chicken with skin. Avoid pre-made or processed food. Limit the amount of salt in your diet to less than 1,500 mg each day. Do not drink alcohol if: Your doctor tells you not to drink. You are pregnant, may be pregnant, or are planning to become pregnant. If you drink alcohol: Limit how much you have to: 0-1 drink a day for women. 0-2 drinks a day for men. Know how much alcohol is in your drink. In the [place] one drink equals one 12 oz bottle of beer (355 mL),  one 5 oz glass of wine (148 mL), or one 1 oz glass of hard liquor (44 mL). Lifestyle  Work with your doctor to stay at a healthy weight or to lose weight. Ask your doctor what the best weight is for you. Get at least 30 minutes of exercise that causes your heart to beat faster (aerobic  exercise) most days of the week. This may include walking, swimming, or biking. Get at least 30 minutes of exercise that strengthens your muscles (resistance exercise) at least 3 days a week. This may include lifting weights or doing Pilates. Do not smoke or use any products that contain nicotine or tobacco. If you need help quitting, ask your doctor. Check your blood pressure at home as told by your doctor. Keep all follow-up visits. Medicines Take over-the-counter and prescription medicines only as told by your doctor. Follow directions carefully. Do not skip doses of blood pressure medicine. The medicine does not work as well if you skip doses. Skipping doses also puts you at risk for problems. Ask your doctor about side effects or reactions to medicines that you should watch for. Contact a doctor if: You think you are having a reaction to the medicine you are taking. You have headaches that keep coming back. You feel dizzy. You have swelling in your ankles. You have trouble with your vision. Get help right away if: You get a very bad headache. You start to feel mixed up (confused). You feel weak or numb. You feel faint. You have very bad pain in your: Chest. Belly (abdomen). You vomit more than once. You have trouble breathing. These symptoms may be an emergency. Get help right away. Call 911. Do not wait to see if the symptoms will go away. Do not drive yourself to the hospital. Summary Hypertension is another name for high blood pressure. High blood pressure forces your heart to work harder to pump blood. For most people, a normal blood pressure is less than 120/80. Making healthy choices can  help lower blood pressure. If your blood pressure does not get lower with healthy choices, you may need to take medicine. This information is not intended to replace advice given to you by your health care provider. Make sure you discuss any questions you have with your health care provider. Document Revised: 01/15/2021 Document Reviewed: 01/15/2021 Elsevier Patient Education  2024 Else[employer] Inc. Managing Your Hypertension Hypertension, also called high blood pressure, is when the force of the blood pressing against the walls of the arteries is too strong. Arteries are blood vessels that carry blood from your heart throughout your body. Hypertension forces the heart to work harder to pump blood and may cause the arteries to become narrow or stiff. Understanding blood pressure readings A blood pressure reading includes a higher number over a lower number: The first, or top, number is called the systolic pressure. It is a measure of the pressure in your arteries as your heart beats. The second, or bottom number, is called the diastolic pressure. It is a measure of the pressure in your arteries as the heart relaxes. For most people, a normal blood pressure is below 120/80. Your personal target blood pressure may vary depending on your medical conditions, your age, and other factors. Blood pressure is classified into four stages. Based on your blood pressure reading, your health care provider may use the following stages to determine what type of treatment you need, if any. Systolic pressure and diastolic pressure are measured in a unit called millimeters of mercury (mmHg). Normal Systolic pressure: below 120. Diastolic pressure: below 80. Elevated Systolic pressure: 120-129. Diastolic pressure: below 80. Hypertension stage 1 Systolic pressure: 130-139. Diastolic pressure: 80-89. Hypertension stage 2 Systolic pressure: 140 or above. Diastolic pressure: 90 or above. How can this condition affect  me? Managing your hypertension is very important. Over time, hypertension can  damage the arteries and decrease blood flow to parts of the body, including the brain, heart, and kidneys. Having untreated or uncontrolled hypertension can lead to: A heart attack. A stroke. A weakened blood vessel (aneurysm). Heart failure. Kidney damage. Eye damage. Memory and concentration problems. Vascular dementia. What actions can I take to manage this condition? Hypertension can be managed by making lifestyle changes and possibly by taking medicines. Your health care provider will help you make a plan to bring your blood pressure within a normal range. You may be referred for counseling on a healthy diet and physical activity. Nutrition  Eat a diet that is high in fiber and potassium, and low in salt (sodium), added sugar, and fat. An example eating plan is called the DASH diet. DASH stands for Dietary Approaches to Stop Hypertension. To eat this way: Eat plenty of fresh fruits and vegetables. Try to fill one-half of your plate at each meal with fruits and vegetables. Eat whole grains, such as whole-wheat pasta, brown rice, or whole-grain bread. Fill about one-fourth of your plate with whole grains. Eat low-fat dairy products. Avoid fatty cuts of meat, processed or cured meats, and poultry with skin. Fill about one-fourth of your plate with lean proteins such as fish, chicken without skin, beans, eggs, and tofu. Avoid pre-made and processed foods. These tend to be higher in sodium, added sugar, and fat. Reduce your daily sodium intake. Many people with hypertension should eat less than 1,500 mg of sodium a day. Lifestyle  Work with your health care provider to maintain a healthy body weight or to lose weight. Ask what an ideal weight is for you. Get at least 30 minutes of exercise that causes your heart to beat faster (aerobic exercise) most days of the week. Activities may include walking, swimming, or  biking. Include exercise to strengthen your muscles (resistance exercise), such as weight lifting, as part of your weekly exercise routine. Try to do these types of exercises for 30 minutes at least 3 days a week. Do not use any products that contain nicotine or tobacco. These products include cigarettes, chewing tobacco, and vaping devices, such as e-cigarettes. If you need help quitting, ask your health care provider. Control any long-term (chronic) conditions you have, such as high cholesterol or diabetes. Identify your sources of stress and find ways to manage stress. This may include meditation, deep breathing, or making time for fun activities. Alcohol use Do not drink alcohol if: Your health care provider tells you not to drink. You are pregnant, may be pregnant, or are planning to become pregnant. If you drink alcohol: Limit how much you have to: 0-1 drink a day for women. 0-2 drinks a day for men. Know how much alcohol is in your drink. In the [place] one drink equals one 12 oz bottle of beer (355 mL), one 5 oz glass of wine (148 mL), or one 1 oz glass of hard liquor (44 mL). Medicines Your health care provider may prescribe medicine if lifestyle changes are not enough to get your blood pressure under control and if: Your systolic blood pressure is 130 or higher. Your diastolic blood pressure is 80 or higher. Take medicines only as told by your health care provider. Follow the directions carefully. Blood pressure medicines must be taken as told by your health care provider. The medicine does not work as well when you skip doses. Skipping doses also puts you at risk for problems. Monitoring Before you monitor your blood pressure: Do not  smoke, drink caffeinated beverages, or exercise within 30 minutes before taking a measurement. Use the bathroom and empty your bladder (urinate). Sit quietly for at least 5 minutes before taking measurements. Monitor your blood pressure at home as told  by your health care provider. To do this: Sit with your back straight and supported. Place your feet flat on the floor. Do not cross your legs. Support your arm on a flat surface, such as a table. Make sure your upper arm is at heart level. Each time you measure, take two or three readings one minute apart and record the results. You may also need to have your blood pressure checked regularly by your health care provider. General information Talk with your health care provider about your diet, exercise habits, and other lifestyle factors that may be contributing to hypertension. Review all the medicines you take with your health care provider because there may be side effects or interactions. Keep all follow-up visits. Your health care provider can help you create and adjust your plan for managing your high blood pressure. Where to find more information National Heart, Lung, and Blood Institute: [employer]is American Heart Association: www.heart.org Contact a health care provider if: You think you are having a reaction to medicines you have taken. You have repeated (recurrent) headaches. You feel dizzy. You have swelling in your ankles. You have trouble with your vision. Get help right away if: You develop a severe headache or confusion. You have unusual weakness or numbness, or you feel faint. You have severe pain in your chest or abdomen. You vomit repeatedly. You have trouble breathing. These symptoms may be an emergency. Get help right away. Call 911. Do not wait to see if the symptoms will go away. Do not drive yourself to the hospital. Summary Hypertension is when the force of blood pumping through your arteries is too strong. If this condition is not controlled, it may put you at risk for serious complications. Your personal target blood pressure may vary depending on your medical conditions, your age, and other factors. For most people, a normal blood pressure is less than  120/80. Hypertension is managed by lifestyle changes, medicines, or both. Lifestyle changes to help manage hypertension include losing weight, eating a healthy, low-sodium diet, exercising more, stopping smoking, and limiting alcohol. This information is not intended to replace advice given to you by your health care provider. Make sure you discuss any questions you have with your health care provider. Document Revised: 12/11/2020 Document Reviewed: 12/11/2020 Elsevier Patient Education  2024 Elsevier Inc.  The above assessment and management plan was discussed with the patient. The patient verbalized understanding of and has agreed to the management plan. Patient is aware to call the clinic if they develop any new symptoms or if symptoms persist or worsen. Patient is aware when to return to the clinic for a follow-up visit. Patient educated on when it is appropriate to go to the emergency department.   Yanil Dawe [place] Thompson, DNP [facility] [address] [place], [place] [address] [phone]

## 2024-01-23 ENCOUNTER — Ambulatory Visit (INDEPENDENT_AMBULATORY_CARE_PROVIDER_SITE_OTHER): Admitting: Nurse Practitioner

## 2024-01-23 ENCOUNTER — Other Ambulatory Visit

## 2024-01-23 ENCOUNTER — Encounter: Payer: Self-pay | Admitting: Nurse Practitioner

## 2024-01-23 VITALS — BP 111/74 | HR 51 | Temp 95.6°F | Ht 67.0 in | Wt 271.0 lb

## 2024-01-23 DIAGNOSIS — H6502 Acute serous otitis media, left ear: Secondary | ICD-10-CM | POA: Diagnosis not present

## 2024-01-23 DIAGNOSIS — R52 Pain, unspecified: Secondary | ICD-10-CM | POA: Diagnosis not present

## 2024-01-23 DIAGNOSIS — R7989 Other specified abnormal findings of blood chemistry: Secondary | ICD-10-CM

## 2024-01-23 MED ORDER — CEFDINIR 300 MG PO CAPS: 300.0000 mg | ORAL_CAPSULE | Freq: Two times a day (BID) | ORAL | 0 refills | Status: DC

## 2024-01-23 NOTE — Progress Notes (Signed)
 Subjective:  Patient ID: Robert Herman, male    DOB: Nov 13, 1980, 43 y.o.   MRN: [id number]  Patient Care Team: Deitra Morton Sebastian Nena, NP as PCP - General (Nurse Practitioner)   Chief Complaint:  Fever, Sore Throat, Cough, Nasal Congestion, and Generalized Body Aches (X 3 days- no otc)   HPI: Robert Herman is a 44 y.o. male presenting on 01/23/2024 for Fever, Sore Throat, Cough, Nasal Congestion, and Generalized Body Aches (X 3 days- no otc)   Discussed the use of AI scribe software for clinical note transcription with the patient, who gave verbal consent to proceed.  History of Present Illness Robert Herman is a 43 year old male who presents with sore throat, cough, and body aches since Friday night.  He has been experiencing a sore throat, cough, and body aches since Friday night. He woke up in the middle of the night with chills, a sore throat, and difficulty swallowing. He also had a headache, body aches, and fever on Friday night and Saturday, but the fever has not recurred since then.  He describes persistent throat pain, making it difficult to swallow, and reports that his ears feel as if they have water in them. He has been experiencing sweats and has a dry nasal passage despite using nasal spray, which has not alleviated the dryness.  On Friday night, he coughed up dark gray phlegm with a possible red tinge, although he was unable to see it clearly due to fatigue and not wearing his glasses.  He smokes half a pack a day but has significantly reduced his smoking since his last visit, having smoked only two or three cigarettes.      Relevant past medical, surgical, family, and social history reviewed and updated as indicated.  Allergies and medications reviewed and updated. Data reviewed: Chart in Epic.   History reviewed. No pertinent past medical history.  History reviewed. No pertinent surgical history.  Social History   Socioeconomic History   Marital  status: Single    Spouse name: Not on file   Number of children: Not on file   Years of education: Not on file   Highest education level: 10th grade  Occupational History   Not on file  Tobacco Use   Smoking status: Every Day    Current packs/day: 0.50    Types: Cigarettes   Smokeless tobacco: Former    Types: Snuff  Vaping Use   Vaping status: Every Day  Substance and Sexual Activity   Alcohol use: No   Drug use: Never   Sexual activity: Yes  Other Topics Concern   Not on file  Social History Narrative   Not on file   Social Drivers of Health   Financial Resource Strain: Medium Risk (01/18/2024)   Overall Financial Resource Strain (CARDIA)    Difficulty of Paying Living Expenses: Somewhat hard  Food Insecurity: Food Insecurity Present (01/18/2024)   Hunger Vital Sign    Worried About Running Out of Food in the Last Year: Not on file    Ran Out of Food in the Last Year: Sometimes true  Transportation Needs: No Transportation Needs (01/18/2024)   PRAPARE - Administrator, Civil Service (Medical): No    Lack of Transportation (Non-Medical): No  Physical Activity: Insufficiently Active (01/18/2024)   Exercise Vital Sign    Days of Exercise per Week: 7 days    Minutes of Exercise per Session: 10 min  Stress: Stress Concern  Present (01/18/2024)   [employer] of Occupational Health - Occupational Stress Questionnaire    Feeling of Stress: To some extent  Social Connections: Moderately Isolated (01/18/2024)   Social Connection and Isolation Panel    Frequency of Communication with Friends and Family: More than three times a week    Frequency of Social Gatherings with Friends and Family: Three times a week    Attends Religious Services: More than 4 times per year    Active Member of Clubs or Organizations: No    Attends Banker Meetings: Not on file    Marital Status: Divorced  Intimate Partner Violence: Not on file    Outpatient Encounter  Medications as of 01/23/2024  Medication Sig   albuterol (VENTOLIN HFA) 108 (90 Base) MCG/ACT inhaler Inhale into the lungs every 6 (six) hours as needed for wheezing or shortness of breath.   busPIRone (BUSPAR) 15 MG tablet Take 10 mg by mouth 2 (two) times daily.   cefdinir (OMNICEF) 300 MG capsule Take 1 capsule (300 mg total) by mouth 2 (two) times daily.   citalopram (CELEXA) 20 MG tablet Take 20 mg by mouth daily.   meloxicam  (MOBIC ) 15 MG tablet Take 15 mg by mouth daily.   pantoprazole (PROTONIX) 20 MG tablet Take 1 tablet (20 mg total) by mouth daily.   semaglutide-weight management (WEGOVY) 0.25 MG/0.5ML SOAJ SQ injection Inject 0.25 mg into the skin every 7 (seven) days.   traZODone (DESYREL) 100 MG tablet Take 100 mg by mouth at bedtime.   valsartan-hydrochlorothiazide (DIOVAN HCT) 80-12.5 MG tablet Take 1 tablet by mouth daily.   [DISCONTINUED] predniSONE (DELTASONE) 5 MG tablet Take 5 mg by mouth daily with breakfast.   [DISCONTINUED] ranitidine (ZANTAC) 150 MG tablet Take 150 mg by mouth 2 (two) times daily. (Patient not taking: Reported on 01/18/2024)   No facility-administered encounter medications on file as of 01/23/2024.    No Known Allergies  Pertinent ROS per HPI, otherwise unremarkable      Objective:  BP 111/74   Pulse (!) 51   Temp (!) 95.6 F (35.3 C)   Ht 5' 7 (1.702 m)   Wt 271 lb (122.9 kg)   SpO2 92%   BMI 42.44 kg/m    Wt Readings from Last 3 Encounters:  01/23/24 271 lb (122.9 kg)  01/18/24 274 lb 6.4 oz (124.5 kg)  01/09/24 276 lb 9.6 oz (125.5 kg)    Physical Exam Vitals and nursing note reviewed.  Constitutional:      Appearance: Normal appearance.  HENT:     Head: Normocephalic and atraumatic.     Right Ear: Hearing, tympanic membrane, ear canal and external ear normal.     Left Ear: Drainage, swelling and tenderness present.     Nose: Nose normal.     Mouth/Throat:     Mouth: Mucous membranes are moist.  Eyes:     General: No  scleral icterus.    Extraocular Movements: Extraocular movements intact.     Conjunctiva/sclera: Conjunctivae normal.     Pupils: Pupils are equal, round, and reactive to light.  Cardiovascular:     Heart sounds: Normal heart sounds.  Pulmonary:     Effort: Pulmonary effort is normal.     Breath sounds: Normal breath sounds.  Musculoskeletal:        General: Normal range of motion.     Right lower leg: No edema.     Left lower leg: No edema.  Skin:    General:  Skin is warm and dry.     Findings: No rash.  Neurological:     Mental Status: He is alert and oriented to person, place, and time.  Psychiatric:        Mood and Affect: Mood normal.        Behavior: Behavior normal.        Thought Content: Thought content normal.        Judgment: Judgment normal.    Physical Exam CHEST: Lungs clear to auscultation bilaterally.     Results for orders placed or performed in visit on 01/09/24  Bayer DCA Hb A1c Waived   Collection Time: 01/09/24  4:30 PM  Result Value Ref Range   HB A1C (BAYER DCA - WAIVED) 5.6 4.8 - 5.6 %  CBC with Differential/Platelet   Collection Time: 01/09/24  4:36 PM  Result Value Ref Range   WBC 15.1 (H) 3.4 - 10.8 x10E3/uL   RBC 4.89 4.14 - 5.80 x10E6/uL   Hemoglobin 15.2 13.0 - 17.7 g/dL   Hematocrit 54.7 62.4 - 51.0 %   MCV 92 79 - 97 fL   MCH 31.1 26.6 - 33.0 pg   MCHC 33.6 31.5 - 35.7 g/dL   RDW 86.9 88.3 - 84.5 %   Platelets 310 150 - 450 x10E3/uL   Neutrophils 62 Not Estab. %   Lymphs 25 Not Estab. %   Monocytes 9 Not Estab. %   Eos 1 Not Estab. %   Basos 1 Not Estab. %   Neutrophils Absolute 9.4 (H) 1.4 - 7.0 x10E3/uL   Lymphocytes Absolute 3.7 (H) 0.7 - 3.1 x10E3/uL   Monocytes Absolute 1.4 (H) 0.1 - 0.9 x10E3/uL   EOS (ABSOLUTE) 0.1 0.0 - 0.4 x10E3/uL   Basophils Absolute 0.1 0.0 - 0.2 x10E3/uL   Immature Granulocytes 2 Not Estab. %   Immature Grans (Abs) 0.3 (H) 0.0 - 0.1 x10E3/uL  CMP14+EGFR   Collection Time: 01/09/24  4:36 PM   Result Value Ref Range   Glucose 104 (H) 70 - 99 mg/dL   BUN 14 6 - 24 mg/dL   Creatinine, Ser 9.10 0.76 - 1.27 mg/dL   eGFR 891 >40 fO/fpw/8.26   BUN/Creatinine Ratio 16 9 - 20   Sodium 140 134 - 144 mmol/L   Potassium 4.1 3.5 - 5.2 mmol/L   Chloride 104 96 - 106 mmol/L   CO2 24 20 - 29 mmol/L   Calcium 9.0 8.7 - 10.2 mg/dL   Total Protein 6.7 6.0 - 8.5 g/dL   Albumin 4.0 (L) 4.1 - 5.1 g/dL   Globulin, Total 2.7 1.5 - 4.5 g/dL   Bilirubin Total <9.7 0.0 - 1.2 mg/dL   Alkaline Phosphatase 89 47 - 123 IU/L   AST 14 0 - 40 IU/L   ALT 34 0 - 44 IU/L  HepB+HepC+HIV Panel   Collection Time: 01/09/24  4:36 PM  Result Value Ref Range   Hepatitis B Surface Ag Negative Negative   Hep B E Ag Negative Negative   Hep B C IgM Negative Negative   Hep B Core Total Ab Negative Negative   Hep B E Ab Non Reactive Negative   Hep B Surface Ab, Qual Non Reactive    Hep C Virus Ab Non Reactive Non Reactive   HIV Screen 4th Generation wRfx Non Reactive Non Reactive  Lipid panel   Collection Time: 01/09/24  4:36 PM  Result Value Ref Range   Cholesterol, Total 138 100 - 199 mg/dL   Triglycerides 894  0 - 149 mg/dL   HDL 47 >60 mg/dL   VLDL Cholesterol Cal 19 5 - 40 mg/dL   LDL Chol Calc (NIH) 72 0 - 99 mg/dL   Chol/HDL Ratio 2.9 0.0 - 5.0 ratio  Thyroid Panel With TSH   Collection Time: 01/09/24  4:36 PM  Result Value Ref Range   TSH 6.750 (H) 0.450 - 4.500 uIU/mL   T4, Total 5.8 4.5 - 12.0 ug/dL   T3 Uptake Ratio 27 24 - 39 %   Free Thyroxine Index 1.6 1.2 - 4.9  PSA, total and free   Collection Time: 01/09/24  4:36 PM  Result Value Ref Range   Prostate Specific Ag, Serum 0.5 0.0 - 4.0 ng/mL   PSA, Free 0.09 N/A ng/mL   PSA, Free Pct 18.0 %       Pertinent labs & imaging results that were available during my care of the patient were reviewed by me and considered in my medical decision making.  Assessment & Plan:  Robert Herman was seen today for fever, sore throat, cough, nasal congestion  and generalized body aches.  Diagnoses and all orders for this visit:  Body aches -     COVID-19, Flu A+B and RSV  Non-recurrent acute serous otitis media of left ear  Other orders -     cefdinir (OMNICEF) 300 MG capsule; Take 1 capsule (300 mg total) by mouth 2 (two) times daily.     Assessment and Plan Robert Herman is a 43 year old Caucasian male seen today for otitis media, no acute distress no Assessment & Plan Acute pharyngitis Persistent throat pain with odynophagia and possible hemoptysis. Awaiting throat swab results. - Await throat swab results, he will be contacted with the results.  Tobacco use disorder Significant reduction in smoking. Actively working on cessation.  Otitis media Cefdinir 300 cefdinir 300 mg twice daily for 7 days Over-the-counter Tylenol/ibuprofen for pain    Continue all other maintenance medications.  Follow up plan: Return if symptoms worsen or fail to improve.   Continue healthy lifestyle choices, including diet (rich in fruits, vegetables, and lean proteins, and low in salt and simple carbohydrates) and exercise (at least 30 minutes of moderate physical activity daily).  Educational handout given for    Otitis Media, Adult  Otitis media is a condition in which the middle ear is red and swollen (inflamed) and full of fluid. The middle ear is the part of the ear that contains bones for hearing as well as air that helps send sounds to the brain. The condition usually goes away on its own. What are the causes? This condition is caused by a blockage in the eustachian tube. This tube connects the middle ear to the back of the nose. It normally allows air into the middle ear. The blockage is caused by fluid or swelling. Problems that can cause blockage include: A cold or infection that affects the nose, mouth, or throat. Allergies. An irritant, such as tobacco smoke. Adenoids that have become large. The adenoids are soft tissue located in the back of  the throat, behind the nose and the roof of the mouth. Growth or swelling in the upper part of the throat, just behind the nose (nasopharynx). Damage to the ear caused by a change in pressure. This is called barotrauma. What increases the risk? You are more likely to develop this condition if you: Smoke or are exposed to tobacco smoke. Have an opening in the roof of your mouth (cleft palate). Have acid reflux.  Have problems in your body's defense system (immune system). What are the signs or symptoms? Symptoms of this condition include: Ear pain. Fever. Problems with hearing. Being tired. Fluid leaking from the ear. Ringing in the ear. How is this treated? This condition can go away on its own within 3-5 days. But if the condition is caused by germs (bacteria) and does not go away on its own, or if it keeps coming back, your doctor may: Give you antibiotic medicines. Give you medicines for pain. Follow these instructions at home: Take over-the-counter and prescription medicines only as told by your doctor. If you were prescribed an antibiotic medicine, take it as told by your doctor. Do not stop taking it even if you start to feel better. Keep all follow-up visits. Contact a doctor if: You have bleeding from your nose. There is a lump on your neck. You are not feeling better in 5 days. You feel worse instead of better. Get help right away if: You have pain that is not helped with medicine. You have swelling, redness, or pain around your ear. You get a stiff neck. You cannot move part of your face (paralysis). You notice that the bone behind your ear hurts when you touch it. You get a very bad headache. Summary Otitis media means that the middle ear is red, swollen, and full of fluid. This condition usually goes away on its own. If the problem does not go away, treatment may be needed. You may be given medicines to treat the infection or to treat your pain. If you were  prescribed an antibiotic medicine, take it as told by your doctor. Do not stop taking it even if you start to feel better. Keep all follow-up visits. This information is not intended to replace advice given to you by your health care provider. Make sure you discuss any questions you have with your health care provider. Document Revised: 07/07/2020 Document Reviewed: 07/07/2020 Elsevier Patient Education  2024 Elsevier Inc.    The above assessment and management plan was discussed with the patient. The patient verbalized understanding of and has agreed to the management plan. Patient is aware to call the clinic if they develop any new symptoms or if symptoms persist or worsen. Patient is aware when to return to the clinic for a follow-up visit. Patient educated on when it is appropriate to go to the emergency department.  Selwyn Reason [place] Thompson, DNP [facility] [address] [place], [place] [address] [phone]

## 2024-01-24 ENCOUNTER — Ambulatory Visit: Payer: Self-pay | Admitting: Nurse Practitioner

## 2024-01-24 LAB — COVID-19, FLU A+B AND RSV
Influenza A, NAA: NOT DETECTED
Influenza B, NAA: NOT DETECTED
RSV, NAA: NOT DETECTED
SARS-CoV-2, NAA: NOT DETECTED

## 2024-01-24 LAB — TSH: TSH: 2.63 u[IU]/mL (ref 0.450–4.500)

## 2024-01-25 ENCOUNTER — Ambulatory Visit: Payer: Self-pay | Admitting: Nurse Practitioner

## 2024-02-07 ENCOUNTER — Ambulatory Visit: Admitting: Nurse Practitioner

## 2024-04-01 ENCOUNTER — Other Ambulatory Visit: Payer: Self-pay | Admitting: Nurse Practitioner

## 2024-04-06 ENCOUNTER — Other Ambulatory Visit: Payer: Self-pay | Admitting: Nurse Practitioner

## 2024-04-06 DIAGNOSIS — I10 Essential (primary) hypertension: Secondary | ICD-10-CM

## 2024-04-19 ENCOUNTER — Ambulatory Visit: Admitting: Podiatry

## 2024-04-19 ENCOUNTER — Encounter: Payer: Self-pay | Admitting: Podiatry

## 2024-04-19 ENCOUNTER — Ambulatory Visit (INDEPENDENT_AMBULATORY_CARE_PROVIDER_SITE_OTHER)

## 2024-04-19 DIAGNOSIS — M7661 Achilles tendinitis, right leg: Secondary | ICD-10-CM | POA: Diagnosis not present

## 2024-04-19 DIAGNOSIS — M722 Plantar fascial fibromatosis: Secondary | ICD-10-CM

## 2024-04-19 MED ORDER — MELOXICAM 15 MG PO TABS: 15.0000 mg | ORAL_TABLET | Freq: Every day | ORAL | 0 refills | Status: AC

## 2024-04-19 NOTE — Progress Notes (Unsigned)
 "  Subjective:  Patient ID: Robert Herman, male    DOB: 10-Feb-1981,  MRN: [id number]  Chief Complaint  Patient presents with   Foot Pain    R foot constant pain in Heel plantar, under medial ankle and achilles x 2 years. Has just gotten worse. Has been getting cortisone injection the past 6 months and also prednisone dose pack.  Not diabetic. No anti coag    Discussed the use of AI scribe software for clinical note transcription with the patient, who gave verbal consent to proceed.  History of Present Illness Robert Herman is a 44 year old male with chronic right plantar fasciitis and Achilles tendinitis who presents for evaluation of progressively worsening right heel pain.  He has had right heel pain for about two years, localized mainly to the plantar and posterior heel with radiation to the medial ankle, arch, and lateral foot. Pain is worst at the plantar heel, with intermittent pain at the Achilles insertion and discomfort throughout the plantar surface. He denies forefoot pain.  Pain is aggravated by weightbearing activity and after rest, including first steps in the morning, after driving, and after prolonged sitting. It can be severe enough to limit walking. He notes marked stiffness in the right ankle and Achilles, and therapeutic exercises are limited by pain.  Over the past six to seven months he has had sporadic corticosteroid injections, most recently over a month ago, without meaningful relief. He has used meloxicam  in the past. He has not used orthotics, braces, or a walking boot. He denies prior foot or ankle injuries or surgeries. Previous podiatric care was with Dr. Roddie in [place], [place] .      Objective:    Physical Exam MUSCULOSKELETAL: Tenderness on heel palpation. Pain on foot dorsiflexion. Decreased range of motion in right ankle. Pain on palpation of plantar aspect of foot.   No images are attached to the encounter.    Results Radiology Right foot  x-ray: Normal calcaneal spur morphology, small accessory ossicle inferior to cuboid, no significant abnormalities (Independently interpreted)   Assessment:   1. Plantar fasciitis of right foot   2. Tendonitis, Achilles, right      Plan:  Patient was evaluated and treated and all questions answered.  Assessment and Plan Assessment & Plan Plantar fasciitis of right foot Chronic, severe plantar fasciitis with significant functional limitation, refractory to conservative measures. Maximal pain at plantar heel with medial and lateral foot involvement. Restricted ankle dorsiflexion and Achilles tightness contribute. Radiographs show heel spur formation, not clinically significant. - Prescribed meloxicam  daily for two weeks, then as needed. - Instructed on revised stretching regimen for plantar fascia and Achilles tendon. - Recommended self-massage using a frozen water bottle, tennis ball, or lacrosse ball. - Advised use of walking boot for 3-4 weeks. - Discussed appropriate footwear and arch support, recommending specific brands; cautioned against expensive non-custom inserts. - Recommended Even Up device for contralateral limb during boot use. - Discussed potential future use of custom orthotics, including insurance and cost. - Planned follow-up in 3-4 weeks to reassess and consider physical therapy or bracing if needed.  Achilles tendinitis of right foot Chronic Achilles tendinitis contributing to heel pain and functional limitation. Marked Achilles tightness and restricted ankle dorsiflexion exacerbate plantar fasciitis. Radiographs reveal early spur formation at Achilles insertion, not clinically significant. - Instructed on targeted Achilles stretching exercises. - Recommended walking boot immobilization to reduce strain. - Advised use of meloxicam  for inflammation control. - Recommended self-massage of Achilles  region with a frozen water bottle or massage ball. - Planned reassessment  in 3-4 weeks to evaluate response and consider further interventions.      No follow-ups on file.    "

## 2024-04-19 NOTE — Patient Instructions (Signed)

## 2024-04-24 ENCOUNTER — Ambulatory Visit: Payer: Self-pay | Admitting: Nurse Practitioner

## 2024-04-24 ENCOUNTER — Encounter: Payer: Self-pay | Admitting: Nurse Practitioner

## 2024-04-24 VITALS — BP 142/103 | HR 109 | Temp 97.5°F | Ht 67.0 in | Wt 287.0 lb

## 2024-04-24 DIAGNOSIS — Z720 Tobacco use: Secondary | ICD-10-CM

## 2024-04-24 DIAGNOSIS — F321 Major depressive disorder, single episode, moderate: Secondary | ICD-10-CM | POA: Diagnosis not present

## 2024-04-24 DIAGNOSIS — H9193 Unspecified hearing loss, bilateral: Secondary | ICD-10-CM

## 2024-04-24 DIAGNOSIS — K2100 Gastro-esophageal reflux disease with esophagitis, without bleeding: Secondary | ICD-10-CM

## 2024-04-24 DIAGNOSIS — E66813 Obesity, class 3: Secondary | ICD-10-CM | POA: Diagnosis not present

## 2024-04-24 DIAGNOSIS — Z716 Tobacco abuse counseling: Secondary | ICD-10-CM | POA: Diagnosis not present

## 2024-04-24 DIAGNOSIS — H6502 Acute serous otitis media, left ear: Secondary | ICD-10-CM

## 2024-04-24 DIAGNOSIS — R7989 Other specified abnormal findings of blood chemistry: Secondary | ICD-10-CM | POA: Diagnosis not present

## 2024-04-24 DIAGNOSIS — Z0001 Encounter for general adult medical examination with abnormal findings: Secondary | ICD-10-CM

## 2024-04-24 DIAGNOSIS — Z6841 Body Mass Index (BMI) 40.0 and over, adult: Secondary | ICD-10-CM | POA: Diagnosis not present

## 2024-04-24 DIAGNOSIS — I10 Essential (primary) hypertension: Secondary | ICD-10-CM | POA: Diagnosis not present

## 2024-04-24 DIAGNOSIS — M722 Plantar fascial fibromatosis: Secondary | ICD-10-CM

## 2024-04-24 LAB — LIPID PANEL

## 2024-04-24 MED ORDER — VALSARTAN-HYDROCHLOROTHIAZIDE 160-25 MG PO TABS: 1.0000 | ORAL_TABLET | Freq: Every day | ORAL | 0 refills | Status: AC

## 2024-04-24 MED ORDER — TRAZODONE HCL 100 MG PO TABS: 100.0000 mg | ORAL_TABLET | Freq: Every evening | ORAL | 0 refills | Status: AC | PRN

## 2024-04-24 MED ORDER — ALBUTEROL SULFATE HFA 108 (90 BASE) MCG/ACT IN AERS: 1.0000 | INHALATION_SPRAY | Freq: Four times a day (QID) | RESPIRATORY_TRACT | 0 refills | Status: DC | PRN

## 2024-04-24 MED ORDER — AMOXICILLIN 875 MG PO TABS: 875.0000 mg | ORAL_TABLET | Freq: Two times a day (BID) | ORAL | 0 refills | Status: AC

## 2024-04-24 MED ORDER — CITALOPRAM HYDROBROMIDE 40 MG PO TABS: 40.0000 mg | ORAL_TABLET | Freq: Every day | ORAL | 0 refills | Status: AC

## 2024-04-24 MED ORDER — WEGOVY 0.25 MG/0.5ML ~~LOC~~ SOAJ: 0.2500 mg | SUBCUTANEOUS | 0 refills | Status: AC

## 2024-04-24 MED ORDER — PANTOPRAZOLE SODIUM 20 MG PO TBEC: 20.0000 mg | DELAYED_RELEASE_TABLET | Freq: Every day | ORAL | 0 refills | Status: AC

## 2024-04-24 NOTE — Progress Notes (Signed)
 "    Subjective:  Patient ID: Robert Herman, male    DOB: 12/21/1980, 44 y.o.   MRN: [id number]  Patient Care Team: Deitra Morton Sebastian Nena, NP as PCP - General (Nurse Practitioner)   Chief Complaint:  Hearing Problem (Would like to have hearing checked. Can't hear well. Ongoing for one year but worse the past few months. ) and Hypertension (Frequent headaces and bp high)   HPI: Robert Herman is a 44 y.o. male presenting on 04/24/2024 for Hearing Problem (Would like to have hearing checked. Can't hear well. Ongoing for one year but worse the past few months. ) and Hypertension (Frequent headaces and bp high)   Discussed the use of AI scribe software for clinical note transcription with the patient, who gave verbal consent to proceed.  History of Present Illness Robert Herman is a 44 year old male with plantar fasciitis who presents with worsening foot pain and hearing loss.  He has been experiencing worsening foot pain due to plantar fasciitis over the past five months. He has been under care, receiving cortisone shots and anti-inflammatory medication without significant relief. He is currently using a boot for four weeks as part of his treatment. The pain is particularly severe upon waking and taking the first step in the morning.  He has a history of hypertension and is currently on medication for it. His recent blood pressure reading was 142/103. He acknowledges a poor diet with high salt intake.  He is experiencing increased anxiety, which he attributes to stress related to a family member's upcoming brain surgery. He is taking Celexa  daily for anxiety management.  He has been experiencing progressive hearing loss over the past year, with symptoms worsening in the last few months. He has difficulty hearing and frequently needs others to repeat himself. He also mentions a recent ear infection.  His current medications include Celexa  for anxiety, albuterol  for asthma, meloxicam  for  inflammation, and trazodone  for sleep. He is not currently taking Wegovy  due to insurance issues.  He has history of insomnia currently being managed with trazodone  50 mg daily report getting 6 to 7 hours of sleep.   Relevant past medical, surgical, family, and social history reviewed and updated as indicated.  Allergies and medications reviewed and updated. Data reviewed: Chart in Epic.   History reviewed. No pertinent past medical history.  History reviewed. No pertinent surgical history.  Social History   Socioeconomic History   Marital status: Single    Spouse name: Not on file   Number of children: Not on file   Years of education: Not on file   Highest education level: 10th grade  Occupational History   Not on file  Tobacco Use   Smoking status: Every Day    Current packs/day: 0.50    Types: Cigarettes   Smokeless tobacco: Former    Types: Snuff  Vaping Use   Vaping status: Every Day  Substance and Sexual Activity   Alcohol use: No   Drug use: Never   Sexual activity: Yes  Other Topics Concern   Not on file  Social History Narrative   Not on file   Social Drivers of Health   Tobacco Use: High Risk (04/24/2024)   Patient History    Smoking Tobacco Use: Every Day    Smokeless Tobacco Use: Former    Passive Exposure: Not on Actuary Strain: Medium Risk (01/18/2024)   Overall Financial Resource Strain (CARDIA)    Difficulty of  Paying Living Expenses: Somewhat hard  Food Insecurity: Food Insecurity Present (01/18/2024)   Epic    Worried About Programme Researcher, Broadcasting/film/video in the Last Year: Not on file    [employer] of Food in the Last Year: Sometimes true  Transportation Needs: No Transportation Needs (01/18/2024)   Epic    Lack of Transportation (Medical): No    Lack of Transportation (Non-Medical): No  Physical Activity: Insufficiently Active (01/18/2024)   Exercise Vital Sign    Days of Exercise per Week: 7 days    Minutes of Exercise per Session: 10 min   Stress: Stress Concern Present (01/18/2024)   [employer] of Occupational Health - Occupational Stress Questionnaire    Feeling of Stress: To some extent  Social Connections: Moderately Isolated (01/18/2024)   Social Connection and Isolation Panel    Frequency of Communication with Friends and Family: More than three times a week    Frequency of Social Gatherings with Friends and Family: Three times a week    Attends Religious Services: More than 4 times per year    Active Member of Clubs or Organizations: No    Attends Banker Meetings: Not on file    Marital Status: Divorced  Intimate Partner Violence: Not on file  Depression (PHQ2-9): High Risk (04/24/2024)   Depression (PHQ2-9)    PHQ-2 Score: 14  Alcohol Screen: Not on file  Housing: Low Risk (01/18/2024)   Epic    Unable to Pay for Housing in the Last Year: No    Number of Times Moved in the Last Year: 1    Homeless in the Last Year: No  Utilities: Not on file  Health Literacy: Not on file    Outpatient Encounter Medications as of 04/24/2024  Medication Sig   amoxicillin  (AMOXIL ) 875 MG tablet Take 1 tablet (875 mg total) by mouth 2 (two) times daily for 10 days.   busPIRone (BUSPAR) 15 MG tablet Take 10 mg by mouth 2 (two) times daily.   citalopram  (CELEXA ) 40 MG tablet Take 1 tablet (40 mg total) by mouth daily.   meloxicam  (MOBIC ) 15 MG tablet Take 1 tablet (15 mg total) by mouth daily.   valsartan -hydrochlorothiazide  (DIOVAN -HCT) 160-25 MG tablet Take 1 tablet by mouth daily.   [DISCONTINUED] albuterol  (VENTOLIN  HFA) 108 (90 Base) MCG/ACT inhaler Inhale into the lungs every 6 (six) hours as needed for wheezing or shortness of breath.   [DISCONTINUED] citalopram  (CELEXA ) 20 MG tablet Take 20 mg by mouth daily.   [DISCONTINUED] pantoprazole  (PROTONIX ) 20 MG tablet Take 1 tablet by mouth once daily   [DISCONTINUED] traZODone  (DESYREL ) 100 MG tablet Take 100 mg by mouth at bedtime.   [DISCONTINUED]  valsartan -hydrochlorothiazide  (DIOVAN -HCT) 80-12.5 MG tablet TAKE 1 TABLET BY MOUTH ONCE DAILY   albuterol  (VENTOLIN  HFA) 108 (90 Base) MCG/ACT inhaler Inhale 1-2 puffs into the lungs every 6 (six) hours as needed for wheezing or shortness of breath.   pantoprazole  (PROTONIX ) 20 MG tablet Take 1 tablet (20 mg total) by mouth daily.   semaglutide -weight management (WEGOVY ) 0.25 MG/0.5ML SOAJ SQ injection Inject 0.25 mg into the skin every 7 (seven) days.   traZODone  (DESYREL ) 100 MG tablet Take 1 tablet (100 mg total) by mouth at bedtime as needed for sleep.   [DISCONTINUED] cefdinir  (OMNICEF ) 300 MG capsule Take 1 capsule (300 mg total) by mouth 2 (two) times daily. (Patient not taking: Reported on 04/24/2024)   [DISCONTINUED] semaglutide -weight management (WEGOVY ) 0.25 MG/0.5ML SOAJ SQ injection Inject 0.25 mg  into the skin every 7 (seven) days. (Patient not taking: Reported on 04/24/2024)   No facility-administered encounter medications on file as of 04/24/2024.    Allergies[1]  Review of Systems  Constitutional:  Negative for chills and fever.  HENT:  Positive for hearing loss.        Bilateral mostly left ear  Respiratory:  Negative for cough and shortness of breath.   Cardiovascular:  Negative for chest pain and leg swelling.  Gastrointestinal:  Negative for constipation, diarrhea, nausea and vomiting.  Musculoskeletal:  Negative for falls.  Skin:  Negative for itching and rash.  Neurological:  Negative for dizziness and headaches.  Psychiatric/Behavioral:  Positive for depression. Negative for suicidal ideas. The patient does not have insomnia.          Objective:  BP (!) 142/103   Pulse (!) 109   Temp (!) 97.5 F (36.4 C)   Ht 5' 7 (1.702 m)   Wt 287 lb (130.2 kg)   SpO2 96%   BMI 44.95 kg/m    Wt Readings from Last 3 Encounters:  04/24/24 287 lb (130.2 kg)  01/23/24 271 lb (122.9 kg)  01/18/24 274 lb 6.4 oz (124.5 kg)    Physical Exam Vitals and nursing note  reviewed.  Constitutional:      Appearance: He is morbidly obese.  HENT:     Head: Normocephalic.     Right Ear: Tympanic membrane, ear canal and external ear normal. There is no impacted cerumen.     Left Ear: Tenderness present. There is no impacted cerumen. Tympanic membrane is erythematous and bulging. Tympanic membrane is not perforated.     Nose: Nose normal.     Mouth/Throat:     Mouth: Mucous membranes are moist.  Eyes:     Extraocular Movements: Extraocular movements intact.     Conjunctiva/sclera: Conjunctivae normal.     Pupils: Pupils are equal, round, and reactive to light.  Neck:     Vascular: No carotid bruit.  Pulmonary:     Effort: Pulmonary effort is normal.     Breath sounds: Normal breath sounds.  Abdominal:     General: Bowel sounds are normal.     Palpations: Abdomen is soft.  Musculoskeletal:        General: Normal range of motion.     Cervical back: Normal range of motion. No rigidity or tenderness.     Left lower leg: No edema.  Lymphadenopathy:     Cervical: No cervical adenopathy.  Skin:    General: Skin is warm and dry.  Neurological:     Mental Status: He is oriented to person, place, and time.  Psychiatric:        Mood and Affect: Mood normal.        Behavior: Behavior normal.        Thought Content: Thought content normal.        Judgment: Judgment normal.    Physical Exam VITALS: BP- 142/103     Results for orders placed or performed in visit on 01/23/24  COVID-19, Flu A+B and RSV   Collection Time: 01/23/24  9:46 AM   Specimen: Nasal Swab  Result Value Ref Range   SARS-CoV-2, NAA Not Detected Not Detected   Influenza A, NAA Not Detected Not Detected   Influenza B, NAA Not Detected Not Detected   RSV, NAA Not Detected Not Detected   Test Information: Comment        Pertinent labs & imaging results that were  available during my care of the patient were reviewed by me and considered in my medical decision making.  Assessment &  Plan:  Robert Herman was seen today for hearing problem and hypertension.  Diagnoses and all orders for this visit:  High thyroid  stimulating hormone (TSH) level -     Thyroid  Panel With TSH  Hearing problem of both ears -     amoxicillin  (AMOXIL ) 875 MG tablet; Take 1 tablet (875 mg total) by mouth 2 (two) times daily for 10 days.  Gastroesophageal reflux disease with esophagitis without hemorrhage -     CBC with Differential/Platelet  Class 3 severe obesity with body mass index (BMI) of 40.0 to 44.9 in adult, unspecified obesity type, unspecified whether serious comorbidity present (HCC) -     Bayer DCA Hb A1c Waived -     semaglutide -weight management (WEGOVY ) 0.25 MG/0.5ML SOAJ SQ injection; Inject 0.25 mg into the skin every 7 (seven) days.  Plantar fasciitis of right foot  Hypertension, essential -     valsartan -hydrochlorothiazide  (DIOVAN -HCT) 160-25 MG tablet; Take 1 tablet by mouth daily. -     CMP14+EGFR  Major depressive disorder, single episode, moderate (HCC) -     citalopram  (CELEXA ) 40 MG tablet; Take 1 tablet (40 mg total) by mouth daily. -     Thyroid  Panel With TSH  Tobacco abuse  Encounter for general adult medical examination with abnormal findings -     albuterol  (VENTOLIN  HFA) 108 (90 Base) MCG/ACT inhaler; Inhale 1-2 puffs into the lungs every 6 (six) hours as needed for wheezing or shortness of breath. -     Bayer DCA Hb A1c Waived -     citalopram  (CELEXA ) 40 MG tablet; Take 1 tablet (40 mg total) by mouth daily. -     pantoprazole  (PROTONIX ) 20 MG tablet; Take 1 tablet (20 mg total) by mouth daily. -     traZODone  (DESYREL ) 100 MG tablet; Take 1 tablet (100 mg total) by mouth at bedtime as needed for sleep. -     semaglutide -weight management (WEGOVY ) 0.25 MG/0.5ML SOAJ SQ injection; Inject 0.25 mg into the skin every 7 (seven) days. -     valsartan -hydrochlorothiazide  (DIOVAN -HCT) 160-25 MG tablet; Take 1 tablet by mouth daily. -     CBC with  Differential/Platelet -     CMP14+EGFR -     Lipid panel -     Thyroid  Panel With TSH  Non-recurrent acute serous otitis media of left ear -     amoxicillin  (AMOXIL ) 875 MG tablet; Take 1 tablet (875 mg total) by mouth 2 (two) times daily for 10 days.  Tobacco abuse counseling     Assessment and Plan Robert Herman is a 43 year old Caucasian male seen today for chronic disease management, no acute distress Assessment & Plan Bilateral hearing loss with acute otitis media of left ear Bilateral hearing loss with worsening symptoms. Acute otitis media in left ear. Likely related to past music exposure. - Prescribed amoxicillin  875 mg for 10 days. - Will perform hearing test. - Will refer to ENT if no improvement in hearing after antibiotic course.  Plantar fasciitis of right foot Chronic plantar fasciitis. Previous treatments ineffective. Surgery not approved by insurance. - Continue using a boot for four weeks. - Perform stretching exercises for plantar fasciitis. - Use a frozen water bottle for foot rolling exercises.  Essential hypertension Blood pressure elevated at 142/103 mmHg. Current medication Valsartan -hydrochlorothiazide  80-12.5 mg daily. High salt intake may contribute to hypertension. - Increased Valsartan -hydrochlorothiazide  to  160-25 mg daily. - Advised on reducing salt intake. - Scheduled follow-up in one week to monitor blood pressure. - Instructed to bring home blood pressure cuff to next appointment for comparison.  Class 3 severe obesity Previous use of Wegovy  not approved by insurance. Attempting to reinitiate medication. - Resent prescription for Wegovy  and checked insurance approval.  Major depressive disorder, single episode, moderate Currently taking Citalopram  20 mg daily. Anxiety increasing due to stress from family member's upcoming surgery. - Increase citalopram  40 mg daily. - Provided additional support for anxiety management. Insomnia Continue trazodone  50  mg nightly for sleep  Smoked half a pack a day for greater than 30 years willing to quit Smoking/Tobacco Cessation Counseling Robert Herman is a current user of tobacco or nicotine products. He is not ready to quit at this time. Counseling provided today addressed the risks of continued use and the benefits of cessation. Discussed tobacco/nicotine use history, readiness to quit, and evidence-based treatment options including behavioral strategies, support resources, and pharmacologic therapies. Provided encouragement and educational materials on steps and resources to quit smoking. Patient questions were addressed, and follow-up recommended for continued support. Total time spent on counseling: 4 minutes.  Labs CBC, CMP, lipid, TSH, A1c result pending    Continue all other maintenance medications.  Follow up plan: Return in about 1 week (around 05/01/2024) for for HTN.   Continue healthy lifestyle choices, including diet (rich in fruits, vegetables, and lean proteins, and low in salt and simple carbohydrates) and exercise (at least 30 minutes of moderate physical activity daily).  Educational handout given for   Clinical References  GERD in Adults: What to Know  Gastroesophageal reflux (GER) is when acid from your stomach flows up into your esophagus. Your esophagus is the part of your body that moves food from your mouth to your stomach. Normally, food goes down and stays in your stomach to be digested. But with GER, food and stomach acid may go back up. You may have a disease called gastroesophageal reflux disease (GERD) if the reflux: Happens often. Causes very bad symptoms. Makes your esophagus sore and swollen. Over time, GERD can make small holes called ulcers in the lining of your esophagus. What are the causes? GERD is caused by a problem with the muscle between your esophagus and stomach. This muscle is called the lower esophageal sphincter (LES). When it's weak or not normal,  it doesn't close like it should. This means food and stomach acid can go back up into your esophagus. The muscle can be weak if: You smoke or use products with tobacco in them. You're pregnant. You have a type of hernia called a hiatal hernia. You eat certain foods and drinks. These include: Alcohol. Coffee. Chocolate. Onions. Peppermint. What increases the risk? Being overweight. Having a disease that affects your connective tissue. Taking NSAIDs, such as ibuprofen. What are the signs or symptoms? Heartburn. Trouble swallowing. Pain when you swallow. The feeling of having a lump in your throat. A bitter taste in your mouth. Bad breath. Having an upset or bloated stomach. Burping. Chest pain. Other conditions can also cause chest pain. Make sure you see your health care provider if you have chest pain. Wheezing. This is when you make high-pitched whistling sounds when you breathe, most often when you breathe out. A long-term cough or a cough at night. How is this diagnosed? GERD may be diagnosed based on your medical history and a physical exam. You may also have tests. These may  include: An endoscopy. This test looks at your stomach and esophagus with a small camera. A barium swallow test. This shows the shape and size of your esophagus and how well it's working. Tests of your esophagus to check for: Acid levels. Pressure. How is this treated? Treatment may depend on how bad your symptoms are. It may include: Changes to your diet and daily life. Medicines. Surgery. Follow these instructions at home: Eating and drinking Follow an eating plan as told by your provider. You may need to avoid certain foods and drinks. These may include: Coffee and tea, with or without caffeine. Alcohol. Energy drinks and sports drinks. Fizzy drinks or sodas. Chocolate and cocoa. Peppermint and mint flavorings. Garlic and onions. Horseradish. Spicy and acidic foods. These  include: Peppers. Chili powder and curry powder. Vinegar. Hot sauces and BBQ sauce. Citrus fruits and juices. These include: Oranges. Lemons. Limes. Tomato-based foods. These include: Red sauce and pizza with red sauce. Chili. Salsa. Fried and fatty foods. These include: Donuts. French fries. Potato chips. High-fat dressings. High-fat meats. These include: Hot dogs and sausage. Rib eye steak. Ham and bacon. High-fat dairy items. These include: Whole milk. Butter. Cream cheese. Eat small meals often. Avoid eating big meals. Avoid drinking lots of liquid with your meals. Try not to eat meals during the 2-3 hours before bedtime. Try not to lie down right after you eat. Do not exercise right after you eat. Lifestyle  If you're overweight, lose an amount of weight that's healthy for you. Ask your provider about a safe weight loss goal. Do not smoke, vape, or use nicotine or tobacco. Wear loose clothes. Do not wear things that are tight around your waist. When you sleep, try: Raising the head of your bed about 6 inches (15 cm). You can use a wedge to do this. Lying down on your left side. Try to lower your stress. If you need help doing this, ask your provider. General instructions Take your medicines only as told. Do not take aspirin or ibuprofen unless you're told to. Watch for any changes in your symptoms. Do not bend over if it makes your symptoms worse. Contact a health care provider if: You have new symptoms. You have trouble: Drinking. Swallowing. Eating. It hurts to swallow. You have wheezing. You have a cough that won't go away. Your voice is hoarse. Your symptoms don't get better with treatment. Get help right away if: You have pain all of a sudden in your: Arm. Neck. Jaw. Teeth. Back. You feel sweaty, dizzy, or light-headed all of a sudden. You faint. You have chest pain or shortness of breath. You vomit and the vomit is: Green, yellow, or  black. Looks like blood or coffee grounds. Your poop is red, bloody, or black. These symptoms may be an emergency. Call 911 right away. Do not wait to see if the symptoms will go away. Do not drive yourself to the hospital. This information is not intended to replace advice given to you by your health care provider. Make sure you discuss any questions you have with your health care provider. Document Revised: 02/08/2023 Document Reviewed: 08/25/2022 Elsevier Patient Education  2024 Else[employer] Inc. GERD in Adults: Diet Changes When you have gastroesophageal reflux disease (GERD), you may need to make changes to your diet. Choosing the right foods can help with your symptoms. Think about working with an expert in healthy eating called a dietitian. They can help you make healthy food choices. What are tips for following this  plan? Reading food labels Look for foods that are low in saturated fat. Foods that may help with your symptoms include: Foods with less than 5% of daily value (DV) of fat. Foods with 0 grams of trans fat. Cooking [employer] in ways that don't use a lot of fat. These ways include: Baking. Steaming. Grilling. Broiling. To add flavor, try to use herbs that are low in spice and acidity. Avoid frying your food. Meal planning  Eat small meals often rather than eating 3 large meals each day. Eat your meals slowly in a place where you feel relaxed. If told by your health care provider, avoid: Foods that cause symptoms. Keep a food diary to keep track of foods that cause symptoms. Alcohol. Drinking a lot of liquid with meals. General instructions For 2-3 hours after you eat, avoid: Bending over. Exercise. Lying down. Chew sugar-free gum after meals. What foods should I eat? Eat a healthy diet. Try to include: Foods with high amounts of fiber. These include: Fruits and vegetables. Whole grains and beans. Low-fat dairy products. Lean meats, fish, and  poultry. Egg whites. Foods that cause symptoms in someone else may not cause symptoms for you. Work with your provider to find foods that are safe for you. The items listed above may not be all the foods and drinks you can have. Talk with a dietitian to learn more. The items listed above may not be a complete list of foods and beverages you can eat and drink. Contact a dietitian for more information. What foods should I avoid? Limiting some of these foods may help with your symptoms. Each person is different. Talk with a dietitian or your provider to help you find the exact foods to avoid. Some of the foods to avoid may include: Fruits Fruits with a lot of acid in them. These may include citrus fruits, such as oranges, grapefruit, pineapple, and lemons. Vegetables Deep-fried vegetables, such as French fries. Vegetables, sauces, or toppings made with added fat and vegetables with acid in them. These may include tomatoes and tomato products, chili peppers, onions, garlic, and horseradish. Grains Pastries or quick breads with added fat. Meats and other proteins High-fat meats, such as fatty beef or pork, hot dogs, ribs, ham, sausage, salami, and bacon. Fried meat or protein, such as fried fish and fried chicken. Egg yolks. Fats and oils Butter. Margarine. Shortening. Ghee. Drinks Coffee and other drinks with caffeine in them. Fizzy and sugary drinks, such as soda and energy drinks. Fruit juice made with acidic fruits, such as orange or grapefruit. Tomato juice. Sweets and desserts Chocolate and cocoa. Donuts. Seasonings and condiments Mint, such as peppermint and spearmint. Condiments, herbs, or seasonings that cause symptoms. These may include curry, hot sauce, or vinegar-based salad dressings. The items listed above may not be all the foods and drinks you should avoid. Talk with a dietitian to learn more. Questions to ask your health care provider Changes to your diet and everyday life are  often the first steps taken to manage symptoms of GERD. If these changes don't help, talk with your provider about taking medicines. Where to find more information [employer]: aboutgerd.org This information is not intended to replace advice given to you by your health care provider. Make sure you discuss any questions you have with your health care provider. Document Revised: 02/08/2023 Document Reviewed: 08/25/2022 Elsevier Patient Education  2024 Else[employer] Inc. Hearing Loss Hearing loss is a partial or total loss of the  ability to hear. This can be temporary or permanent, and it can happen in one or both ears. There are two types of hearing loss. You can have just one type or both types. You may have a problem with: Damage to your hearing nerves (sensorineural hearing loss). This type of hearing loss is more likely to be permanent. A hearing aid is often the best treatment. Sound getting to your inner ear (conductive hearing loss). This type of hearing loss can usually be treated medically or surgically. Medical care is necessary to treat hearing loss properly and to prevent the condition from getting worse. Your hearing may partially or completely come back, depending on what caused your hearing loss and how severe it is. In some cases, hearing loss is permanent. What are the causes? Common causes of hearing loss include: Too much wax in the ear canal. Infection of the ear canal or middle ear. Fluid in the middle ear. Injury to the ear or surrounding area. An object stuck in the ear. A history of prolonged exposure to loud sounds, such as music. Less common causes of hearing loss include: Tumors in the ear. Viral or bacterial infections, such as meningitis. A hole in the eardrum (perforated eardrum). Problems with the hearing nerve that sends signals between the brain and the ear. Certain medicines. What are the signs or symptoms? Symptoms of  this condition may include: Difficulty telling the difference between sounds. Difficulty following a conversation when there is background noise. Lack of response to sounds in your environment. This may be most noticeable when you do not respond to startling sounds. Needing to turn up the volume on the television, radio, or other devices. Ringing in the ears. Dizziness. How is this diagnosed? This condition is diagnosed based on: A physical exam. A hearing test (audiometry). The test will be performed by a hearing specialist (audiologist). Imaging tests, such as an MRI or CT scan. You may also be referred to an ear, nose, and throat (ENT) specialist (otolaryngologist). How is this treated? Treatment for hearing loss may include: Earwax removal. Medicines to treat or prevent infection (antibiotics). Medicines to reduce inflammation (corticosteroids). Hearing aids or assistive listening devices. Follow these instructions at home: If you were prescribed an antibiotic medicine, take it as told by your health care provider. Do not stop taking the antibiotic even if you start to feel better. Take over-the-counter and prescription medicines only as told by your health care provider. Avoid loud noises. Use hearing protection if you are exposed to loud noises or work in a noisy environment. Return to your normal activities as told by your health care provider. Ask your health care provider what activities are safe for you. Keep all follow-up visits. This is important. Contact a health care provider if: You feel dizzy. You develop new symptoms. You vomit or feel nauseous. You have a fever. Get help right away if: You develop sudden changes in your vision. You have severe ear pain. You have new or increased weakness. You have a severe headache. Summary Hearing loss is a decreased ability to hear sounds around you. It can be temporary or permanent. Treatment will depend on the cause of your  hearing loss. It may include earwax removal, medicines, or a hearing aid. Your hearing may partially or completely come back, depending on what caused your hearing loss and how severe it is. Keep all follow-up visits. This is important. This information is not intended to replace advice given to you by your health  care provider. Make sure you discuss any questions you have with your health care provider. Document Revised: 02/05/2021 Document Reviewed: 02/05/2021 Elsevier Patient Education  2024 [employer] Preventing Hearing Loss, Adult  Hearing loss is a partial or total loss of the ability to hear. Hearing loss may start suddenly or gradually, at any age. It may be temporary or permanent, and it may affect one or both ears. There are two types of hearing loss. You can have just one type or both types. You may have a problem with: Damage to your hearing nerves (sensorineural hearing loss). This type of hearing loss is more likely to be permanent. A hearing aid is often the best treatment. Sound getting to your inner ear (conductive hearing loss). This type of hearing loss can usually be treated medically or surgically. Hearing loss may be referred to as deafness. Symptoms that may develop along with hearing loss include ringing in your ear (tinnitus), fullness in your ear, and dizziness (vertigo). Hearing loss that is not associated with aging can often be prevented by taking certain measures to protect your ears. How can hearing loss affect me? Hearing loss can affect you at school, at work, and at home. It can lower your overall quality of life. You may: Have trouble having conversations, especially in busy places with a lot of background noise. Need to make changes at work or at school. Feel depressed, isolated, or anxious. Sometimes hearing loss can make it more difficult to make friends, play sports, and have an active social life. Struggle to hear the TV, radio, or sound at the  movies. Have trouble hearing the phone or doorbell. Have trouble hearing alarms and other warning sounds. What changes can I make to protect myself from hearing loss? Have hearing tests (screenings) as often as directed by your health care provider. Hearing screenings can help detect hearing loss early and may help prevent hearing loss from getting worse. This may include having screenings done by: An ear, nose, and throat (ENT) specialist (otolaryngologist). A specialist in hearing problems (audiologist). Noise damage is the most preventable cause of hearing loss. Noise damage is caused by the loudness of the noise and how long you are exposed to it. Noises that can cause temporary or permanent nerve damage include noises from: [employer] systems or chainsaws. Guns (firearms). Sirens. [employer]. To avoid hearing loss from noise exposure: Wear hearing protection whenever you are exposed to loud noises or working in a noisy environment, such as: Using a lexicographer. Shooting firearms. Woodworking with machines. Being near jet engines or sirens. If you are exposed to loud noises at your job, make sure that you are provided with the proper hearing protection. Do not sit close to speakers at concerts. If you listen to music, keep the volume at a comfortable level. If you wear headphones, make sure that the noise is only loud enough for you to hear. If someone else can hear it, it is too loud. Where to find more information Find more information about how to prevent hearing loss from: Centers for Disease Control and Prevention: footballexhibition.com.br [employer] on Deafness and Other Communication Disorders: pooldevices.com.pt Contact a health care provider if you have: Any change in your hearing. Sudden hearing loss. Other symptoms, such as: Ear pain. Ear pressure. Tinnitus. Vertigo. Summary Have your hearing screened to detect early hearing loss and to prevent further  loss. Hearing loss may be caused by damage to your hearing nerves, problems with sound  getting to your inner ear, or both. Avoiding loud noises is the best way to prevent hearing loss. If your hearing changes suddenly, tell your health care provider right away. This information is not intended to replace advice given to you by your health care provider. Make sure you discuss any questions you have with your health care provider. Document Revised: 02/05/2021 Document Reviewed: 02/05/2021 Elsevier Patient Education  2024 [employer]  The above assessment and management plan was discussed with the patient. The patient verbalized understanding of and has agreed to the management plan. Patient is aware to call the clinic if they develop any new symptoms or if symptoms persist or worsen. Patient is aware when to return to the clinic for a follow-up visit. Patient educated on when it is appropriate to go to the emergency department.   Justin Buechner [place] Thompson, DNP [facility] [address] [place], [place] [address] [phone]      [1] No Known Allergies  "

## 2024-04-25 LAB — CBC WITH DIFFERENTIAL/PLATELET
Basophils Absolute: 0.1 x10E3/uL (ref 0.0–0.2)
Basos: 1 %
EOS (ABSOLUTE): 0.4 x10E3/uL (ref 0.0–0.4)
Eos: 3 %
Hematocrit: 45.2 % (ref 37.5–51.0)
Hemoglobin: 14.6 g/dL (ref 13.0–17.7)
Immature Grans (Abs): 0 x10E3/uL (ref 0.0–0.1)
Immature Granulocytes: 0 %
Lymphocytes Absolute: 2.9 x10E3/uL (ref 0.7–3.1)
Lymphs: 26 %
MCH: 29.7 pg (ref 26.6–33.0)
MCHC: 32.3 g/dL (ref 31.5–35.7)
MCV: 92 fL (ref 79–97)
Monocytes Absolute: 1 x10E3/uL — ABNORMAL HIGH (ref 0.1–0.9)
Monocytes: 9 %
Neutrophils Absolute: 6.6 x10E3/uL (ref 1.4–7.0)
Neutrophils: 61 %
Platelets: 292 x10E3/uL (ref 150–450)
RBC: 4.91 x10E6/uL (ref 4.14–5.80)
RDW: 12.6 % (ref 11.6–15.4)
WBC: 10.9 x10E3/uL — ABNORMAL HIGH (ref 3.4–10.8)

## 2024-04-25 LAB — CMP14+EGFR
ALT: 41 IU/L (ref 0–44)
AST: 27 IU/L (ref 0–40)
Albumin: 4 g/dL — AB (ref 4.1–5.1)
Alkaline Phosphatase: 77 IU/L (ref 47–123)
BUN/Creatinine Ratio: 14 (ref 9–20)
BUN: 14 mg/dL (ref 6–24)
Bilirubin Total: 0.2 mg/dL (ref 0.0–1.2)
CO2: 22 mmol/L (ref 20–29)
Calcium: 9.5 mg/dL (ref 8.7–10.2)
Chloride: 102 mmol/L (ref 96–106)
Creatinine, Ser: 1.02 mg/dL (ref 0.76–1.27)
Globulin, Total: 2.7 g/dL (ref 1.5–4.5)
Glucose: 120 mg/dL — AB (ref 70–99)
Potassium: 4.3 mmol/L (ref 3.5–5.2)
Sodium: 142 mmol/L (ref 134–144)
Total Protein: 6.7 g/dL (ref 6.0–8.5)
eGFR: 92 mL/min/1.73

## 2024-04-25 LAB — THYROID PANEL WITH TSH
Free Thyroxine Index: 2 (ref 1.2–4.9)
T3 Uptake Ratio: 27 % (ref 24–39)
T4, Total: 7.4 ug/dL (ref 4.5–12.0)
TSH: 3.24 u[IU]/mL (ref 0.450–4.500)

## 2024-04-25 LAB — BAYER DCA HB A1C WAIVED: HB A1C (BAYER DCA - WAIVED): 5.8 % — ABNORMAL HIGH (ref 4.8–5.6)

## 2024-04-25 LAB — LIPID PANEL
Cholesterol, Total: 236 mg/dL — AB (ref 100–199)
HDL: 36 mg/dL — AB
LDL CALC COMMENT:: 6.6 ratio — AB (ref 0.0–5.0)
LDL Chol Calc (NIH): 145 mg/dL — AB (ref 0–99)
Triglycerides: 300 mg/dL — AB (ref 0–149)
VLDL Cholesterol Cal: 55 mg/dL — AB (ref 5–40)

## 2024-04-26 ENCOUNTER — Ambulatory Visit: Payer: Self-pay | Admitting: Nurse Practitioner

## 2024-04-26 DIAGNOSIS — E782 Mixed hyperlipidemia: Secondary | ICD-10-CM | POA: Insufficient documentation

## 2024-04-26 DIAGNOSIS — R7303 Prediabetes: Secondary | ICD-10-CM | POA: Insufficient documentation

## 2024-04-27 MED ORDER — ROSUVASTATIN CALCIUM 10 MG PO TABS: 10.0000 mg | ORAL_TABLET | Freq: Every day | ORAL | 3 refills | Status: AC

## 2024-05-01 ENCOUNTER — Encounter: Payer: Self-pay | Admitting: Nurse Practitioner

## 2024-05-01 ENCOUNTER — Ambulatory Visit (INDEPENDENT_AMBULATORY_CARE_PROVIDER_SITE_OTHER): Admitting: Nurse Practitioner

## 2024-05-01 VITALS — BP 125/78 | HR 90 | Temp 97.9°F | Ht 67.0 in | Wt 283.0 lb

## 2024-05-01 DIAGNOSIS — R7303 Prediabetes: Secondary | ICD-10-CM | POA: Diagnosis not present

## 2024-05-01 DIAGNOSIS — I10 Essential (primary) hypertension: Secondary | ICD-10-CM

## 2024-05-01 DIAGNOSIS — Z1211 Encounter for screening for malignant neoplasm of colon: Secondary | ICD-10-CM

## 2024-05-01 DIAGNOSIS — R0683 Snoring: Secondary | ICD-10-CM | POA: Diagnosis not present

## 2024-05-01 DIAGNOSIS — R079 Chest pain, unspecified: Secondary | ICD-10-CM | POA: Diagnosis not present

## 2024-05-01 DIAGNOSIS — Z6841 Body Mass Index (BMI) 40.0 and over, adult: Secondary | ICD-10-CM

## 2024-05-01 DIAGNOSIS — E66813 Obesity, class 3: Secondary | ICD-10-CM | POA: Diagnosis not present

## 2024-05-01 DIAGNOSIS — Z720 Tobacco use: Secondary | ICD-10-CM | POA: Diagnosis not present

## 2024-05-01 NOTE — Progress Notes (Signed)
 "    Subjective:  Patient ID: Robert Herman, male    DOB: [birth year], 44 y.o.   MRN: [id number]  Patient Care Team: Deitra Morton Sebastian Nena, NP as PCP - General (Nurse Practitioner)   Chief Complaint:  Medical Management of Chronic Issues (1 week HTN f/u)   HPI: Robert Herman is a 44 y.o. male presenting on 05/01/2024 for Medical Management of Chronic Issues (1 week HTN f/u)   Discussed the use of AI scribe software for clinical note transcription with the patient, who gave verbal consent to proceed.  History of Present Illness Robert Herman is a 44 year old male with hypertension who presents for a 1-week follow-up visit.  At his last visit on April 24, 2024, his blood pressure was 142/100 mmHg, and his valsartan  dosage was increased to 160/25 mg. His home blood pressure readings have averaged around 143/99 mmHg, though the accuracy of the wrist cuff is questioned. He smokes two cigarettes a day, reduced from a pack and a half daily.  He experiences intermittent chest pressure since Sunday morning, described as 'somebody sitting on your chest.' The episodes last about five minutes and occur while at rest, such as when sleeping. The pain is rated as 7-8 out of 10. The last episode occurred yesterday around 10:30-11:00 AM. He did not seek emergency care as the pain subsided on its own. He has a history of similar chest pressure but is unsure if it is related to gastrointestinal issues, as he is treated with pantoprazole  20 mg daily for GERD. No radiation to the arm.  He has been unable to obtain Wegovy  due to insurance denial. He has attempted diet and exercise with minimal weight loss. He reports significant snoring and episodes of apnea during sleep, as noted by his wife. He has not undergone a sleep study previously.   Relevant past medical, surgical, family, and social history reviewed and updated as indicated.  Allergies and medications reviewed and updated. Data reviewed: Chart  in Epic.   History reviewed. No pertinent past medical history.  History reviewed. No pertinent surgical history.  Social History   Socioeconomic History   Marital status: Single    Spouse name: Not on file   Number of children: Not on file   Years of education: Not on file   Highest education level: 10th grade  Occupational History   Not on file  Tobacco Use   Smoking status: Every Day    Current packs/day: 0.50    Types: Cigarettes   Smokeless tobacco: Former    Types: Snuff  Vaping Use   Vaping status: Every Day  Substance and Sexual Activity   Alcohol use: No   Drug use: Never   Sexual activity: Yes  Other Topics Concern   Not on file  Social History Narrative   Not on file   Social Drivers of Health   Tobacco Use: High Risk (05/01/2024)   Patient History    Smoking Tobacco Use: Every Day    Smokeless Tobacco Use: Former    Passive Exposure: Not on Actuary Strain: Low Risk (04/27/2024)   Overall Financial Resource Strain (CARDIA)    Difficulty of Paying Living Expenses: Not hard at all  Food Insecurity: No Food Insecurity (04/27/2024)   Epic    Worried About Programme Researcher, Broadcasting/film/video in the Last Year: Never true    Ran Out of Food in the Last Year: Never true  Transportation Needs: No Transportation Needs (  04/27/2024)   Epic    Lack of Transportation (Medical): No    Lack of Transportation (Non-Medical): No  Physical Activity: Insufficiently Active (04/27/2024)   Exercise Vital Sign    Days of Exercise per Week: 3 days    Minutes of Exercise per Session: 10 min  Stress: Stress Concern Present (04/27/2024)   [employer] of Occupational Health - Occupational Stress Questionnaire    Feeling of Stress: Rather much  Social Connections: Moderately Integrated (04/27/2024)   Social Connection and Isolation Panel    Frequency of Communication with Friends and Family: More than three times a week    Frequency of Social Gatherings with Friends and  Family: More than three times a week    Attends Religious Services: More than 4 times per year    Active Member of [employer] or Organizations: Yes    Attends Banker Meetings: More than 4 times per year    Marital Status: Divorced  Intimate Partner Violence: Not on file  Depression (PHQ2-9): Medium Risk (05/01/2024)   Depression (PHQ2-9)    PHQ-2 Score: 8  Alcohol Screen: Not on file  Housing: Unknown (04/27/2024)   Epic    Unable to Pay for Housing in the Last Year: No    Number of Times Moved in the Last Year: Not on file    Homeless in the Last Year: No  Utilities: Not on file  Health Literacy: Not on file    Outpatient Encounter Medications as of 05/01/2024  Medication Sig   albuterol  (VENTOLIN  HFA) 108 (90 Base) MCG/ACT inhaler Inhale 1-2 puffs into the lungs every 6 (six) hours as needed for wheezing or shortness of breath.   amoxicillin  (AMOXIL ) 875 MG tablet Take 1 tablet (875 mg total) by mouth 2 (two) times daily for 10 days.   busPIRone (BUSPAR) 15 MG tablet Take 10 mg by mouth 2 (two) times daily.   citalopram  (CELEXA ) 40 MG tablet Take 1 tablet (40 mg total) by mouth daily.   meloxicam  (MOBIC ) 15 MG tablet Take 1 tablet (15 mg total) by mouth daily.   pantoprazole  (PROTONIX ) 20 MG tablet Take 1 tablet (20 mg total) by mouth daily.   rosuvastatin  (CRESTOR ) 10 MG tablet Take 1 tablet (10 mg total) by mouth daily.   semaglutide -weight management (WEGOVY ) 0.25 MG/0.5ML SOAJ SQ injection Inject 0.25 mg into the skin every 7 (seven) days.   traZODone  (DESYREL ) 100 MG tablet Take 1 tablet (100 mg total) by mouth at bedtime as needed for sleep.   valsartan -hydrochlorothiazide  (DIOVAN -HCT) 160-25 MG tablet Take 1 tablet by mouth daily.   No facility-administered encounter medications on file as of 05/01/2024.    Allergies[1]  Pertinent ROS per HPI, otherwise unremarkable      Objective:  BP 125/78   Pulse 90   Temp 97.9 F (36.6 C)   Ht 5' 7 (1.702 m)   Wt 283  lb (128.4 kg)   SpO2 94%   BMI 44.32 kg/m    Wt Readings from Last 3 Encounters:  05/01/24 283 lb (128.4 kg)  04/24/24 287 lb (130.2 kg)  01/23/24 271 lb (122.9 kg)   EKG 05/01/2024 Rate: 80 BPM PR: 136 msec QT: 368 msec QTcH 403 msec msec QRSD: 96 msec P-QRS-T: 29/57/23 degree  Interpretation: Sinus Rhythm   Physical Exam Vitals and nursing note reviewed.  Constitutional:      Appearance: Normal appearance.  HENT:     Head: Normocephalic and atraumatic.     Nose: Nose normal.  Mouth/Throat:     Mouth: Mucous membranes are moist.  Eyes:     General: No scleral icterus.    Extraocular Movements: Extraocular movements intact.     Conjunctiva/sclera: Conjunctivae normal.     Pupils: Pupils are equal, round, and reactive to light.  Cardiovascular:     Heart sounds: Normal heart sounds.  Pulmonary:     Effort: Pulmonary effort is normal.     Breath sounds: Normal breath sounds.  Musculoskeletal:        General: Normal range of motion.     Right lower leg: No edema.     Left lower leg: No edema.  Skin:    General: Skin is warm and dry.     Findings: No rash.  Neurological:     Mental Status: He is alert and oriented to person, place, and time.  Psychiatric:        Mood and Affect: Mood normal.        Behavior: Behavior normal.        Thought Content: Thought content normal.        Judgment: Judgment normal.    Physical Exam VITALS: BP- 125/78 MEASUREMENTS: BMI- 42.32.     Results for orders placed or performed in visit on 04/24/24  Bayer DCA Hb A1c Waived   Collection Time: 04/24/24  4:20 PM  Result Value Ref Range   HB A1C (BAYER DCA - WAIVED) 5.8 (H) 4.8 - 5.6 %  CBC with Differential/Platelet   Collection Time: 04/24/24  4:22 PM  Result Value Ref Range   WBC 10.9 (H) 3.4 - 10.8 x10E3/uL   RBC 4.91 4.14 - 5.80 x10E6/uL   Hemoglobin 14.6 13.0 - 17.7 g/dL   Hematocrit 54.7 62.4 - 51.0 %   MCV 92 79 - 97 fL   MCH 29.7 26.6 - 33.0 pg   MCHC 32.3 31.5 -  35.7 g/dL   RDW 87.3 88.3 - 84.5 %   Platelets 292 150 - 450 x10E3/uL   Neutrophils 61 Not Estab. %   Lymphs 26 Not Estab. %   Monocytes 9 Not Estab. %   Eos 3 Not Estab. %   Basos 1 Not Estab. %   Neutrophils Absolute 6.6 1.4 - 7.0 x10E3/uL   Lymphocytes Absolute 2.9 0.7 - 3.1 x10E3/uL   Monocytes Absolute 1.0 (H) 0.1 - 0.9 x10E3/uL   EOS (ABSOLUTE) 0.4 0.0 - 0.4 x10E3/uL   Basophils Absolute 0.1 0.0 - 0.2 x10E3/uL   Immature Granulocytes 0 Not Estab. %   Immature Grans (Abs) 0.0 0.0 - 0.1 x10E3/uL  CMP14+EGFR   Collection Time: 04/24/24  4:22 PM  Result Value Ref Range   Glucose 120 (H) 70 - 99 mg/dL   BUN 14 6 - 24 mg/dL   Creatinine, Ser 8.97 0.76 - 1.27 mg/dL   eGFR 92 >40 fO/fpw/8.26   BUN/Creatinine Ratio 14 9 - 20   Sodium 142 134 - 144 mmol/L   Potassium 4.3 3.5 - 5.2 mmol/L   Chloride 102 96 - 106 mmol/L   CO2 22 20 - 29 mmol/L   Calcium  9.5 8.7 - 10.2 mg/dL   Total Protein 6.7 6.0 - 8.5 g/dL   Albumin 4.0 (L) 4.1 - 5.1 g/dL   Globulin, Total 2.7 1.5 - 4.5 g/dL   Bilirubin Total 0.2 0.0 - 1.2 mg/dL   Alkaline Phosphatase 77 47 - 123 IU/L   AST 27 0 - 40 IU/L   ALT 41 0 - 44 IU/L  Lipid panel  Collection Time: 04/24/24  4:22 PM  Result Value Ref Range   Cholesterol, Total 236 (H) 100 - 199 mg/dL   Triglycerides 699 (H) 0 - 149 mg/dL   HDL 36 (L) >60 mg/dL   VLDL Cholesterol Cal 55 (H) 5 - 40 mg/dL   LDL Chol Calc (NIH) 854 (H) 0 - 99 mg/dL   Chol/HDL Ratio 6.6 (H) 0.0 - 5.0 ratio  Thyroid  Panel With TSH   Collection Time: 04/24/24  4:22 PM  Result Value Ref Range   TSH 3.240 0.450 - 4.500 uIU/mL   T4, Total 7.4 4.5 - 12.0 ug/dL   T3 Uptake Ratio 27 24 - 39 %   Free Thyroxine Index 2.0 1.2 - 4.9       Pertinent labs & imaging results that were available during my care of the patient were reviewed by me and considered in my medical decision making.  Assessment & Plan:  Azahel was seen today for medical management of chronic issues.  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 Home readings likely inaccurate due to wrist cuff use. - Continue valsartan -hydrochlorothiazide  160-25 mg daily. - Advised to decrease salt intake. - Encouraged use of a more accurate blood pressure cuff.  Chest pain Intermittent chest pressure with differential including cardiac and gastrointestinal causes.  Class 3 Herman obesity BMI 42.32. Previous Wegovy  attempt denied by insurance. Minimal weight loss with diet and exercise. - Referred to pharmacist for prior authorization of Wegovy . - Encouraged continued diet and exercise.  Possible obstructive sleep apnea Significant snoring and apnea episodes reported. No prior sleep study. - Referred for home sleep study.  Smoking/Tobacco Cessation Counseling Robert Herman is a current user of tobacco or nicotine products. He is not ready to quit at this time. Counseling provided today addressed the risks of continued use and the benefits of cessation. Discussed tobacco/nicotine use history, readiness to quit, and evidence-based treatment options including behavioral strategies, support resources, and pharmacologic therapies. Provided encouragement and educational materials on steps and resources to quit smoking. Patient  questions were addressed, and follow-up recommended for continued support. Total time spent on counseling: 4 minutes.  Tobacco abuse Currently smoking two cigarettes per day, reduced from a pack and a half per day.  General health maintenance No prior colonoscopy. - Referred to GI for colonoscopy.        Continue all other maintenance medications.  Follow up plan: Return in about 3 months (around 07/30/2024) for Chronic disease management.   Continue healthy lifestyle choices, including diet (rich in fruits, vegetables, and lean proteins, and low in salt and simple carbohydrates) and exercise (at least 30 minutes of moderate physical activity daily).  Educational handout given for   Clinical References  Chest Wall Pain Chest wall pain is pain in or around the bones and muscles of your chest. Chest wall pain may be caused by: An injury. Coughing a lot. Using your chest and arm muscles too much. Sometimes, the cause may not be known. This pain may take a few weeks or longer to get better. Follow these instructions at home: Managing pain, stiffness, and swelling If told, put ice on the painful area: Put ice in a plastic bag. Place a towel between your skin and the bag. Leave the ice on for 20 minutes, 2-3 times a day.   Activity Rest as told by your doctor. Avoid doing things that cause pain. This includes lifting heavy items. Ask your doctor what activities are safe for you. General instructions  Take over-the-counter and prescription medicines only as told by your doctor. Do not use any products that contain nicotine or tobacco, such as cigarettes, e-cigarettes, and chewing tobacco. If you need help quitting, ask your doctor. Keep all follow-up visits as told by your doctor. This is important. Contact a doctor if: You have a fever. Your chest pain gets worse. You have new symptoms. Get help right away if: You feel sick to your stomach (nauseous) or you throw up  (vomit). You feel sweaty or light-headed. You have a cough with mucus from your lungs (sputum) or you cough up blood. You are short of breath. These symptoms may be an emergency. Do not wait to see if the symptoms will go away. Get medical help right away. Call your local emergency services (911 in the [place] Do not drive yourself to the hospital. Summary Chest wall pain is pain in or around the bones and muscles of your chest. It may be treated with ice, rest, and medicines. Your condition may also get better if you avoid doing things that cause pain. Contact a doctor if you have a fever, chest pain that gets worse, or new symptoms. Get help right away if you feel light-headed or you get short of breath. These symptoms may be an emergency. This information is not intended to replace advice given to you by your health care provider. Make sure you discuss any questions you have with your health care provider. Document Revised: 03/22/2022 Document Reviewed: 03/22/2022 Elsevier Patient Education  2024 Elsevier Inc. Nonspecific Chest Pain Chest pain can be caused by many different conditions. Some causes of chest pain can be life-threatening. These will require treatment right away. Serious causes of chest pain include: Heart attack. A tear in the body's main blood vessel. Redness and swelling (inflammation) around your heart. Blood clot in your lungs. Other causes of chest pain may not be so serious. These include: Heartburn. Anxiety or stress. Damage to bones or muscles in your chest. Lung infections. Chest pain can feel like: Pain or discomfort in your chest. Crushing, pressure, aching, or squeezing pain. Burning or tingling. Dull or sharp pain that is worse when you move, cough, or take a deep breath. Pain or discomfort that is also felt in your back, neck, jaw, shoulder, or arm, or pain that spreads to any of these areas. It is hard to know whether your pain is caused by something  that is serious or something that is not so serious. So it is important to see your doctor right away if you have chest pain. Follow these instructions at home: Medicines Take over-the-counter and prescription medicines only as told by your doctor. If you were prescribed an antibiotic medicine, take it  as told by your doctor. Do not stop taking the antibiotic even if you start to feel better. Lifestyle  Rest as told by your doctor. Do not use any products that contain nicotine or tobacco, such as cigarettes, e-cigarettes, and chewing tobacco. If you need help quitting, ask your doctor. Do not drink alcohol. Make lifestyle changes as told by your doctor. These may include: Getting regular exercise. Ask your doctor what activities are safe for you. Eating a heart-healthy diet. A diet and nutrition specialist (dietitian) can help you to learn healthy eating options. Staying at a healthy weight. Treating diabetes or high blood pressure, if needed. Lowering your stress. Activities such as yoga and relaxation techniques can help. General instructions Pay attention to any changes in your symptoms. Tell your doctor about them or any new symptoms. Avoid any activities that cause chest pain. Keep all follow-up visits as told by your doctor. This is important. You may need more testing if your chest pain does not go away. Contact a doctor if: Your chest pain does not go away. You feel depressed. You have a fever. Get help right away if: Your chest pain is worse. You have a cough that gets worse, or you cough up blood. You have very bad (Herman) pain in your belly (abdomen). You pass out (faint). You have either of these for no clear reason: Sudden chest discomfort. Sudden discomfort in your arms, back, neck, or jaw. You have shortness of breath at any time. You suddenly start to sweat, or your skin gets clammy. You feel sick to your stomach (nauseous). You throw up (vomit). You suddenly feel  lightheaded or dizzy. You feel very weak or tired. Your heart starts to beat fast, or it feels like it is skipping beats. These symptoms may be an emergency. Do not wait to see if the symptoms will go away. Get medical help right away. Call your local emergency services (911 in the [place] Do not drive yourself to the hospital. Summary Chest pain can be caused by many different conditions. The cause may be serious and need treatment right away. If you have chest pain, see your doctor right away. Follow your doctor's instructions for taking medicines and making lifestyle changes. Keep all follow-up visits as told by your doctor. This includes visits for any further testing if your chest pain does not go away. Be sure to know the signs that show that your condition has become worse. Get help right away if you have these symptoms. This information is not intended to replace advice given to you by your health care provider. Make sure you discuss any questions you have with your health care provider. Document Revised: 02/11/2022 Document Reviewed: 02/11/2022 Elsevier Patient Education  2024 Elsevier Inc. BMI for Adults Body mass index (BMI) is a number found using a person's weight and height. BMI can help tell how much of a person's weight is made up of fat. BMI does not measure body fat directly. It is used instead of tests that directly measure body fat, which can be difficult and expensive. What are BMI measurements used for? BMI is useful to: Find out if your weight puts you at higher risk for medical problems. Help recommend changes, such as in diet and exercise. This can help you reach a healthy weight. BMI screening can be done again to see if these changes are working. How is BMI calculated? Your height and weight are measured. The BMI is found from those numbers. This can be  done with [place] or metric measurements. Note that charts and online BMI calculators are available to help you find your  BMI quickly and easily without doing these calculations. To calculate your BMI in [place] measurements: Measure your weight in pounds (lb). Multiply the number of pounds by 703. So, for an adult who weighs 150 lb, multiply that number by 703: 150 x 703, which equals 105,450. Measure your height in inches. Then multiply that number by itself to get a measurement called inches squared. So, for an adult who is 70 inches tall, the inches squared measurement is 70 inches x 70 inches, which equals 4,900 inches squared. Divide the total from step 2 (number of lb x 703) by the total from step 3 (inches squared): 105,450  4,900 = 21.5. This is your BMI. To calculate your BMI in metric measurements:  Measure your weight in kilograms (kg). For this example, the weight is 70 kg. Measure your height in meters (m). Then multiply that number by itself to get a measurement called meters squared. So, for an adult who is 1.75 m tall, the meters squared measurement is 1.75 m x 1.75 m, which equals 3.1 meters squared. Divide the number of kilograms (your weight) by the meters squared number. In this example: 70  3.1 = 22.6. This is your BMI. What do the results mean? BMI charts are used to see if you are underweight, normal weight, overweight, or obese. The following guidelines will be used: Underweight: BMI less than 18.5. Normal weight: BMI between 18.5 and 24.9. Overweight: BMI between 25 and 29.9. Obese: BMI of 30 or above. BMI is a tool and cannot diagnose a condition. Talk with your health care provider about what your BMI means for you. Keep these notes in mind: Weight includes fat and muscle. Someone with a muscular build, such as an athlete, may have a BMI that is higher than 24.9. In cases like these, BMI is not a correct measure of body fat. If you have a BMI of 25 or higher, your provider may need to do more testing to find out if excess body fat is the cause. BMI is measured the same way for  males and females. Females usually have more body fat than males of the same height and weight. Where to find more information For more information about BMI, including tools to quickly find your BMI, go to: Centers for Disease Control and Prevention: tonerpromos.no American Heart Association: heart.org National Heart, Lung, and Blood Institute: buffalodrycleaner.gl This information is not intended to replace advice given to you by your health care provider. Make sure you discuss any questions you have with your health care provider. Document Revised: 12/17/2021 Document Reviewed: 12/10/2021 Elsevier Patient Education  2024 Elsevier Inc. Hypertension, Adult High blood pressure (hypertension) is when the force of blood pumping through the arteries is too strong. The arteries are the blood vessels that carry blood from the heart throughout the body. Hypertension forces the heart to work harder to pump blood and may cause arteries to become narrow or stiff. Untreated or uncontrolled hypertension can lead to a heart attack, heart failure, a stroke, kidney disease, and other problems. A blood pressure reading consists of a higher number over a lower number. Ideally, your blood pressure should be below 120/80. The first (top) number is called the systolic pressure. It is a measure of the pressure in your arteries as your heart beats. The second (bottom) number is called the diastolic pressure. It is a measure  of the pressure in your arteries as the heart relaxes. What are the causes? The exact cause of this condition is not known. There are some conditions that result in high blood pressure. What increases the risk? Certain factors may make you more likely to develop high blood pressure. Some of these risk factors are under your control, including: Smoking. Not getting enough exercise or physical activity. Being overweight. Having too much fat, sugar, calories, or salt (sodium) in your diet. Drinking too much  alcohol. Other risk factors include: Having a personal history of heart disease, diabetes, high cholesterol, or kidney disease. Stress. Having a family history of high blood pressure and high cholesterol. Having obstructive sleep apnea. Age. The risk increases with age. What are the signs or symptoms? High blood pressure may not cause symptoms. Very high blood pressure (hypertensive crisis) may cause: Headache. Fast or irregular heartbeats (palpitations). Shortness of breath. Nosebleed. Nausea and vomiting. Vision changes. Herman chest pain, dizziness, and seizures. How is this diagnosed? This condition is diagnosed by measuring your blood pressure while you are seated, with your arm resting on a flat surface, your legs uncrossed, and your feet flat on the floor. The cuff of the blood pressure monitor will be placed directly against the skin of your upper arm at the level of your heart. Blood pressure should be measured at least twice using the same arm. Certain conditions can cause a difference in blood pressure between your right and left arms. If you have a high blood pressure reading during one visit or you have normal blood pressure with other risk factors, you may be asked to: Return on a different day to have your blood pressure checked again. Monitor your blood pressure at home for 1 week or longer. If you are diagnosed with hypertension, you may have other blood or imaging tests to help your health care provider understand your overall risk for other conditions. How is this treated? This condition is treated by making healthy lifestyle changes, such as eating healthy foods, exercising more, and reducing your alcohol intake. You may be referred for counseling on a healthy diet and physical activity. Your health care provider may prescribe medicine if lifestyle changes are not enough to get your blood pressure under control and if: Your systolic blood pressure is above 130. Your  diastolic blood pressure is above 80. Your personal target blood pressure may vary depending on your medical conditions, your age, and other factors. Follow these instructions at home: Eating and drinking  Eat a diet that is high in fiber and potassium, and low in sodium, added sugar, and fat. An example of this eating plan is called the DASH diet. DASH stands for Dietary Approaches to Stop Hypertension. To eat this way: Eat plenty of fresh fruits and vegetables. Try to fill one half of your plate at each meal with fruits and vegetables. Eat whole grains, such as whole-wheat pasta, brown rice, or whole-grain bread. Fill about one fourth of your plate with whole grains. Eat or drink low-fat dairy products, such as skim milk or low-fat yogurt. Avoid fatty cuts of meat, processed or cured meats, and poultry with skin. Fill about one fourth of your plate with lean proteins, such as fish, chicken without skin, beans, eggs, or tofu. Avoid pre-made and processed foods. These tend to be higher in sodium, added sugar, and fat. Reduce your daily sodium intake. Many people with hypertension should eat less than 1,500 mg of sodium a day. Do not drink alcohol  if: Your health care provider tells you not to drink. You are pregnant, may be pregnant, or are planning to become pregnant. If you drink alcohol: Limit how much you have to: 0-1 drink a day for women. 0-2 drinks a day for men. Know how much alcohol is in your drink. In the [place] one drink equals one 12 oz bottle of beer (355 mL), one 5 oz glass of wine (148 mL), or one 1 oz glass of hard liquor (44 mL). Lifestyle  Work with your health care provider to maintain a healthy body weight or to lose weight. Ask what an ideal weight is for you. Get at least 30 minutes of exercise that causes your heart to beat faster (aerobic exercise) most days of the week. Activities may include walking, swimming, or biking. Include exercise to strengthen your muscles  (resistance exercise), such as Pilates or lifting weights, as part of your weekly exercise routine. Try to do these types of exercises for 30 minutes at least 3 days a week. Do not use any products that contain nicotine or tobacco. These products include cigarettes, chewing tobacco, and vaping devices, such as e-cigarettes. If you need help quitting, ask your health care provider. Monitor your blood pressure at home as told by your health care provider. Keep all follow-up visits. This is important. Medicines Take over-the-counter and prescription medicines only as told by your health care provider. Follow directions carefully. Blood pressure medicines must be taken as prescribed. Do not skip doses of blood pressure medicine. Doing this puts you at risk for problems and can make the medicine less effective. Ask your health care provider about side effects or reactions to medicines that you should watch for. Contact a health care provider if you: Think you are having a reaction to a medicine you are taking. Have headaches that keep coming back (recurring). Feel dizzy. Have swelling in your ankles. Have trouble with your vision. Get help right away if you: Develop a Herman headache or confusion. Have unusual weakness or numbness. Feel faint. Have Herman pain in your chest or abdomen. Vomit repeatedly. Have trouble breathing. These symptoms may be an emergency. Get help right away. Call 911. Do not wait to see if the symptoms will go away. Do not drive yourself to the hospital. Summary Hypertension is when the force of blood pumping through your arteries is too strong. If this condition is not controlled, it may put you at risk for serious complications. Your personal target blood pressure may vary depending on your medical conditions, your age, and other factors. For most people, a normal blood pressure is less than 120/80. Hypertension is treated with lifestyle changes, medicines, or a  combination of both. Lifestyle changes include losing weight, eating a healthy, low-sodium diet, exercising more, and limiting alcohol. This information is not intended to replace advice given to you by your health care provider. Make sure you discuss any questions you have with your health care provider. Document Revised: 02/03/2021 Document Reviewed: 02/03/2021 Elsevier Patient Education  2024 [employer] Shortness of Breath, Adult Shortness of breath means you have trouble breathing. Shortness of breath could be a sign of a medical problem. Follow these instructions at home:  Pollution Do not smoke or use any products that contain nicotine or tobacco. If you need help quitting, ask your doctor. Avoid things that can make it harder to breathe, such as: Smoke of all kinds. This includes smoke from campfires or forest fires. Do not smoke or allow others  to smoke in your home. Mold. Dust. Air pollution. Chemical smells. Things that can give you an allergic reaction (allergens) if you have allergies. Keep your living space clean. Use products that help remove mold and dust. General instructions Watch for any changes in your symptoms. Take over-the-counter and prescription medicines only as told by your doctor. This includes oxygen therapy and inhaled medicines. Rest as needed. Return to your normal activities when your doctor says that it is safe. Keep all follow-up visits. Contact a doctor if: Your condition does not get better as soon as expected. You have a hard time doing your normal activities, even after you rest. You have new symptoms. You cannot walk up stairs. You cannot exercise the way you normally do. Get help right away if: Your shortness of breath gets worse. You have trouble breathing when you are resting. You feel light-headed or you faint. You have a cough that is not helped by medicines. You cough up blood. You have pain with breathing. You have pain in your  chest, arms, shoulders, or belly (abdomen). You have a fever. These symptoms may be an emergency. Get help right away. Call 911. Do not wait to see if the symptoms will go away. Do not drive yourself to the hospital. Summary Shortness of breath is when you have trouble breathing enough air. It can be a sign of a medical problem. Avoid things that make it hard for you to breathe, such as smoking, pollution, mold, and dust. Watch for any changes in your symptoms. Contact your doctor if you do not get better or you get worse. This information is not intended to replace advice given to you by your health care provider. Make sure you discuss any questions you have with your health care provider. Document Revised: 11/15/2020 Document Reviewed: 11/15/2020 Elsevier Patient Education  2024 [employer] Diabetes Mellitus and Exercise Regular exercise is important for your health, especially if you have diabetes mellitus. Exercise is not just about losing weight. It can also help you increase muscle strength and bone density and reduce body fat and stress. This can help your level of endurance and make you more fit and flexible. Why should I exercise if I have diabetes? Exercise has many benefits for people with diabetes. It can: Help lower and control your blood sugar (glucose). Help your body respond better and become more sensitive to the hormone insulin. Reduce how much insulin your body needs. Lower your risk for heart disease by: Lowering how much bad cholesterol and triglycerides you have in your body. Increasing how much good cholesterol you have in your body. Lowering your blood pressure. Lowering your blood glucose levels. What is my activity plan? Your health care provider or an expert trained in diabetes care (certified diabetes educator) can help you make an activity plan. This plan can help you find the type of exercise that works for you. It may also tell you how often to exercise  and for how long. Be sure to: Get at least 150 minutes of medium-intensity or high-intensity exercise each week. This may involve brisk walking, biking, or water aerobics. Do stretching and strengthening exercises at least 2 times a week. This may involve yoga or weight lifting. Spread out your activity over at least 3 days of the week. Get some form of physical activity each day. Do not go more than 2 days in a row without some kind of activity. Avoid being inactive for more than 30 minutes at a time. Take  frequent breaks to walk or stretch. Choose activities that you enjoy. Set goals that you know you can accomplish. Start slowly and increase the intensity of your exercise over time. How do I manage my diabetes during exercise?  Monitor your blood glucose Check your blood glucose before and after you exercise. If your blood glucose is 240 mg/dL (86.6 mmol/L) or higher before you exercise, check your urine for ketones. These are chemicals created by the liver. If you have ketones in your urine, do not exercise until your blood glucose returns to normal. If your blood glucose is 100 mg/dL (5.6 mmol/L) or lower, eat a snack that has 15-20 grams of carbohydrate in it. Check your blood glucose 15 minutes after the snack to make sure that your level is above 100 mg/dL (5.6 mmol/L) before you start to exercise. Your risk for low blood glucose (hypoglycemia) goes up during and after exercise. Know the symptoms of this condition and how to treat it. Follow these instructions at home: Keep a carbohydrate snack on hand for use before, during, and after exercise. This can help prevent or treat hypoglycemia. Avoid injecting insulin into parts of your body that are going to be used during exercise. This may include: Your arms, when you are going to play tennis. Your legs, when you are about to go jogging. Keep track of your exercise habits. This can help you and your health care provider watch and adjust  your activity plan. Write down: What you eat before and after you exercise. Blood glucose levels before and after you exercise. The type and amount of exercise you do. Talk to your health care provider before you start a new activity. They may need to: Make sure that the activity is safe for you. Adjust your insulin, other medicines, and food that you eat. Drink water while you exercise. This can stop you from losing too much water (dehydration). It can also prevent problems caused by having a lot of heat in your body (heat stroke). Where to find more information American Diabetes Association: diabetes.org Association of Diabetes [employer] & Education Specialists: diabeteseducator.org This information is not intended to replace advice given to you by your health care provider. Make sure you discuss any questions you have with your health care provider. Document Revised: 09/16/2021 Document Reviewed: 09/16/2021 Elsevier Patient Education  2024 [employer]  The above assessment and management plan was discussed with the patient. The patient verbalized understanding of and has agreed to the management plan. Patient is aware to call the clinic if they develop any new symptoms or if symptoms persist or worsen. Patient is aware when to return to the clinic for a follow-up visit. Patient educated on when it is appropriate to go to the emergency department.    Kalid Ghan [place] Thompson, DNP [facility] [address] [place], [place] [address] [phone]      [1] No Known Allergies  "

## 2024-05-14 ENCOUNTER — Other Ambulatory Visit: Payer: Self-pay | Admitting: Nurse Practitioner

## 2024-05-14 DIAGNOSIS — Z0001 Encounter for general adult medical examination with abnormal findings: Secondary | ICD-10-CM

## 2024-05-17 ENCOUNTER — Ambulatory Visit: Admitting: Podiatry

## 2024-06-18 ENCOUNTER — Ambulatory Visit: Admitting: Podiatry

## 2024-07-26 ENCOUNTER — Other Ambulatory Visit

## 2024-07-31 ENCOUNTER — Ambulatory Visit: Admitting: Family Medicine
